# Patient Record
Sex: Female | Born: 1950
Health system: Southern US, Community
[De-identification: ages and names within clinical notes are randomized; demographics above are authoritative.]

## PROBLEM LIST (undated history)

## (undated) DIAGNOSIS — R112 Nausea with vomiting, unspecified: Secondary | ICD-10-CM

## (undated) DIAGNOSIS — E039 Hypothyroidism, unspecified: Secondary | ICD-10-CM

## (undated) DIAGNOSIS — E7800 Pure hypercholesterolemia, unspecified: Secondary | ICD-10-CM

## (undated) DIAGNOSIS — Z9889 Other specified postprocedural states: Secondary | ICD-10-CM

## (undated) DIAGNOSIS — I10 Essential (primary) hypertension: Secondary | ICD-10-CM

---

## 2001-02-15 ENCOUNTER — Other Ambulatory Visit: Admission: RE | Admit: 2001-02-15 | Discharge: 2001-02-15 | Payer: Self-pay | Admitting: Family Medicine

## 2001-02-16 ENCOUNTER — Encounter: Payer: Self-pay | Admitting: Family Medicine

## 2001-02-16 ENCOUNTER — Ambulatory Visit (HOSPITAL_COMMUNITY): Admission: RE | Admit: 2001-02-16 | Discharge: 2001-02-16 | Payer: Self-pay | Admitting: Family Medicine

## 2004-02-01 ENCOUNTER — Ambulatory Visit (HOSPITAL_COMMUNITY): Admission: RE | Admit: 2004-02-01 | Discharge: 2004-02-01 | Payer: Self-pay | Admitting: Family Medicine

## 2004-02-05 ENCOUNTER — Ambulatory Visit (HOSPITAL_COMMUNITY): Admission: RE | Admit: 2004-02-05 | Discharge: 2004-02-05 | Payer: Self-pay | Admitting: Family Medicine

## 2008-05-05 ENCOUNTER — Emergency Department (HOSPITAL_COMMUNITY): Admission: EM | Admit: 2008-05-05 | Discharge: 2008-05-05 | Payer: Self-pay | Admitting: Emergency Medicine

## 2008-10-15 ENCOUNTER — Ambulatory Visit (HOSPITAL_COMMUNITY): Admission: RE | Admit: 2008-10-15 | Discharge: 2008-10-15 | Payer: Self-pay | Admitting: Internal Medicine

## 2010-08-18 LAB — STREP A DNA PROBE: Group A Strep Probe: NEGATIVE

## 2010-08-18 LAB — RAPID STREP SCREEN (MED CTR MEBANE ONLY): Streptococcus, Group A Screen (Direct): NEGATIVE

## 2010-12-16 ENCOUNTER — Other Ambulatory Visit (HOSPITAL_COMMUNITY): Payer: Self-pay | Admitting: Internal Medicine

## 2010-12-16 DIAGNOSIS — Z139 Encounter for screening, unspecified: Secondary | ICD-10-CM

## 2010-12-18 ENCOUNTER — Ambulatory Visit (HOSPITAL_COMMUNITY)
Admission: RE | Admit: 2010-12-18 | Discharge: 2010-12-18 | Disposition: A | Discharge status: Home / Self Care | Payer: BC Managed Care – PPO | Source: Ambulatory Visit | Attending: Internal Medicine | Admitting: Internal Medicine

## 2010-12-18 DIAGNOSIS — Z139 Encounter for screening, unspecified: Secondary | ICD-10-CM

## 2010-12-18 DIAGNOSIS — Z1231 Encounter for screening mammogram for malignant neoplasm of breast: Secondary | ICD-10-CM | POA: Insufficient documentation

## 2010-12-19 ENCOUNTER — Other Ambulatory Visit (HOSPITAL_COMMUNITY): Payer: Self-pay | Admitting: Internal Medicine

## 2010-12-19 DIAGNOSIS — Z139 Encounter for screening, unspecified: Secondary | ICD-10-CM

## 2010-12-24 ENCOUNTER — Ambulatory Visit (HOSPITAL_COMMUNITY)
Admission: RE | Admit: 2010-12-24 | Discharge: 2010-12-24 | Disposition: A | Discharge status: Home / Self Care | Payer: BC Managed Care – PPO | Source: Ambulatory Visit | Attending: Internal Medicine | Admitting: Internal Medicine

## 2010-12-24 ENCOUNTER — Telehealth: Payer: Self-pay

## 2010-12-24 DIAGNOSIS — M949 Disorder of cartilage, unspecified: Secondary | ICD-10-CM | POA: Insufficient documentation

## 2010-12-24 DIAGNOSIS — Z13820 Encounter for screening for osteoporosis: Secondary | ICD-10-CM | POA: Insufficient documentation

## 2010-12-24 DIAGNOSIS — Z139 Encounter for screening, unspecified: Secondary | ICD-10-CM

## 2010-12-24 DIAGNOSIS — M899 Disorder of bone, unspecified: Secondary | ICD-10-CM | POA: Insufficient documentation

## 2010-12-24 NOTE — Telephone Encounter (Signed)
Gastroenterology Pre-Procedure Form  Request Date: 12/24/2010      Requesting Physician: Dwana Melena, MD     PATIENT INFORMATION:  Kaitlyn Franklin is a 60 y.o., female (DOB=14-Jul-1950).  PROCEDURE: Procedure(s) requested: colonoscopy Procedure Reason: screening for colon cancer  PATIENT REVIEW QUESTIONS: The patient reports the following:   1. Diabetes Melitis: no 2. Joint replacements in the past 12 months: no 3. Major health problems in the past 3 months: no 4. Has an artificial valve or MVP:no 5. Has been advised in past to take antibiotics in advance of a procedure like teeth cleaning: no}    MEDICATIONS & ALLERGIES:    Patient reports the following regarding taking any blood thinners:   Plavix? no Aspirin?yes  Coumadin?  no  Patient confirms/reports the following medications:  Current Outpatient Prescriptions  Medication Sig Dispense Refill  . aspirin 81 MG tablet Take 81 mg by mouth daily.        . fish oil-omega-3 fatty acids 1000 MG capsule Take 1 g by mouth daily.        Marland Kitchen levothyroxine (SYNTHROID, LEVOTHROID) 75 MCG tablet Take 75 mcg by mouth daily.        . Multiple Vitamin (MULTIVITAMIN) tablet Take 1 tablet by mouth daily.        Marland Kitchen telmisartan-hydrochlorothiazide (MICARDIS HCT) 40-12.5 MG per tablet Take 1 tablet by mouth daily.        Marland Kitchen UNKNOWN TO PATIENT Calcium 600mg  plus Vit D One tablet twice a day       . vitamin A 8000 UNIT capsule Take 8,000 Units by mouth daily.          Patient confirms/reports the following allergies:  Not on File  Patient is appropriate to schedule for requested procedure(s): yes  AUTHORIZATION INFORMATION Primary Insurance: ,  ID #  Group # Pre-Cert / Auth required: Pre-Cert / Auth #:   Secondary Insurance: ,  ID #: ,  Group #:  Pre-Cert / Auth required Pre-Cert / Auth #:   No orders of the defined types were placed in this encounter.    SCHEDULE INFORMATION: Procedure has been scheduled as follows:  Date: 12/31/2010       Time: 9:45 AM  Location: [facility] Short Stay  This Gastroenterology Pre-Precedure Form is being routed to the following provider(s) for review: R. Roetta Sessions, MD

## 2010-12-24 NOTE — Telephone Encounter (Signed)
LMOM for pt to call to be triaged for a screening colonoscopy.

## 2010-12-25 ENCOUNTER — Other Ambulatory Visit: Payer: Self-pay

## 2010-12-25 DIAGNOSIS — Z139 Encounter for screening, unspecified: Secondary | ICD-10-CM

## 2010-12-25 NOTE — Telephone Encounter (Signed)
OK for colonoscopy.  

## 2010-12-25 NOTE — Telephone Encounter (Signed)
Faxing the instructions and Rx to [facility].

## 2010-12-26 NOTE — Telephone Encounter (Signed)
Closed

## 2010-12-30 MED ORDER — SODIUM CHLORIDE 0.45 % IV SOLN
Freq: Once | INTRAVENOUS | Status: AC
  Administered 2010-12-31: 11:00:00 via INTRAVENOUS

## 2010-12-31 ENCOUNTER — Ambulatory Visit (HOSPITAL_COMMUNITY)
Admission: RE | Admit: 2010-12-31 | Discharge: 2010-12-31 | Disposition: A | Discharge status: Home / Self Care | Payer: BC Managed Care – PPO | Source: Ambulatory Visit | Attending: Internal Medicine | Admitting: Internal Medicine

## 2010-12-31 ENCOUNTER — Encounter (HOSPITAL_COMMUNITY): Payer: Self-pay | Admitting: *Deleted

## 2010-12-31 ENCOUNTER — Encounter (HOSPITAL_COMMUNITY)
Admission: RE | Disposition: A | Discharge status: Home / Self Care | Payer: Self-pay | Source: Ambulatory Visit | Attending: Internal Medicine

## 2010-12-31 ENCOUNTER — Other Ambulatory Visit: Payer: Self-pay | Admitting: Internal Medicine

## 2010-12-31 DIAGNOSIS — D126 Benign neoplasm of colon, unspecified: Secondary | ICD-10-CM

## 2010-12-31 DIAGNOSIS — Z7982 Long term (current) use of aspirin: Secondary | ICD-10-CM | POA: Insufficient documentation

## 2010-12-31 DIAGNOSIS — K5730 Diverticulosis of large intestine without perforation or abscess without bleeding: Secondary | ICD-10-CM | POA: Insufficient documentation

## 2010-12-31 DIAGNOSIS — Z1211 Encounter for screening for malignant neoplasm of colon: Secondary | ICD-10-CM | POA: Insufficient documentation

## 2010-12-31 DIAGNOSIS — Z79899 Other long term (current) drug therapy: Secondary | ICD-10-CM | POA: Insufficient documentation

## 2010-12-31 DIAGNOSIS — I10 Essential (primary) hypertension: Secondary | ICD-10-CM | POA: Insufficient documentation

## 2010-12-31 DIAGNOSIS — E7800 Pure hypercholesterolemia, unspecified: Secondary | ICD-10-CM | POA: Insufficient documentation

## 2010-12-31 HISTORY — DX: Hypothyroidism, unspecified: E03.9

## 2010-12-31 HISTORY — DX: Pure hypercholesterolemia, unspecified: E78.00

## 2010-12-31 HISTORY — PX: COLONOSCOPY: SHX5424

## 2010-12-31 HISTORY — DX: Essential (primary) hypertension: I10

## 2010-12-31 SURGERY — COLONOSCOPY
Anesthesia: Moderate Sedation

## 2010-12-31 MED ORDER — MEPERIDINE HCL 100 MG/ML IJ SOLN
INTRAMUSCULAR | Status: DC | PRN
  Administered 2010-12-31 (×2): 50 mg via INTRAVENOUS

## 2010-12-31 MED ORDER — MEPERIDINE HCL 100 MG/ML IJ SOLN
INTRAMUSCULAR | Status: AC
  Filled 2010-12-31: qty 2

## 2010-12-31 MED ORDER — MIDAZOLAM HCL 5 MG/5ML IJ SOLN
INTRAMUSCULAR | Status: DC | PRN
  Administered 2010-12-31: 1 mg via INTRAVENOUS
  Administered 2010-12-31 (×2): 2 mg via INTRAVENOUS

## 2010-12-31 MED ORDER — MIDAZOLAM HCL 5 MG/5ML IJ SOLN
INTRAMUSCULAR | Status: AC
  Filled 2010-12-31: qty 10

## 2010-12-31 NOTE — H&P (Signed)
Primary Care Physician:  Dwana Melena, MD Primary Gastroenterologist:  Dr. Jena Gauss  Pre-Procedure History & Physical: HPI:  Kaitlyn Franklin is a 60 y.o. female is here for a screening colonoscopy. No bowel symptoms. No family history of Polyps or colon cancer. This is her first ever colonoscopy.  Past Medical History  Diagnosis Date  . Hypertension   . Hypercholesteremia   . Hypothyroidism     History reviewed. No pertinent past surgical history.  Prior to Admission medications   Medication Sig Start Date End Date Taking? Authorizing Provider  acetaminophen (TYLENOL) 500 MG tablet Take 1,000 mg by mouth every 6 (six) hours as needed.     Yes Historical Provider, MD  aspirin 81 MG tablet Take 81 mg by mouth daily.     Yes Historical Provider, MD  fish oil-omega-3 fatty acids 1000 MG capsule Take 1 g by mouth daily.     Yes Historical Provider, MD  levothyroxine (SYNTHROID, LEVOTHROID) 75 MCG tablet Take 75 mcg by mouth daily.     Yes Historical Provider, MD  Multiple Vitamin (MULTIVITAMIN) tablet Take 1 tablet by mouth daily.     Yes Historical Provider, MD  rosuvastatin (CRESTOR) 5 MG tablet Take 5 mg by mouth daily.     Yes Historical Provider, MD  telmisartan-hydrochlorothiazide (MICARDIS HCT) 40-12.5 MG per tablet Take 1 tablet by mouth daily.     Yes Historical Provider, MD  UNKNOWN TO PATIENT Calcium 600mg  plus Vit D One tablet twice a day    Yes Historical Provider, MD  vitamin A 8000 UNIT capsule Take 8,000 Units by mouth daily.     Yes Historical Provider, MD    Allergies as of 12/25/2010  . (No Known Allergies)    History reviewed. No pertinent family history.  History   Social History  . Marital Status: Married    Spouse Name: N/A    Number of Children: N/A  . Years of Education: N/A   Occupational History  . Not on file.   Social History Main Topics  . Smoking status: Never Smoker   . Smokeless tobacco: Not on file  . Alcohol Use: No  . Drug Use: No  .  Sexually Active:    Other Topics Concern  . Not on file   Social History Narrative  . No narrative on file    Review of Systems: See HPI, otherwise negative ROS  Physical Exam: BP 155/86  Pulse 90  Temp(Src) 98.9 F (37.2 C) (Oral)  Resp 13  Ht 5\' 4"  (1.626 m)  Wt 197 lb (89.359 kg)  BMI 33.82 kg/m2  SpO2 98% General:   Alert,  Well-developed, well-nourished, pleasant and cooperative in NAD Head:  Normocephalic and atraumatic. Eyes:  Sclera clear, no icterus.   Conjunctiva pink. Ears:  Normal auditory acuity. Nose:  No deformity, discharge,  or lesions. Mouth:  No deformity or lesions, dentition normal. Neck:  Supple; no masses or thyromegaly. Lungs:  Clear throughout to auscultation.   No wheezes, crackles, or rhonchi. No acute distress. Heart:  Regular rate and rhythm; no murmurs, clicks, rubs,  or gallops. Abdomen:  Soft, nontender and nondistended. No masses, hepatosplenomegaly or hernias noted. Normal bowel sounds, without guarding, and without rebound.   Msk:  Symmetrical without gross deformities. Normal posture. Pulses:  Normal pulses noted. Extremities:  Without clubbing or edema. Neurologic:  Alert and  oriented x4;  grossly normal neurologically. Skin:  Intact without significant lesions or rashes. Cervical Nodes:  No significant cervical adenopathy. Psych:  Alert and cooperative. Normal mood and affect.  Impression/Plan: Kaitlyn Franklin is now here to undergo a screening colonoscopy.   Average risk.  Risks, benefits, limitations, imponderables and alternatives regarding colonoscopy have been reviewed with the patient. Questions have been answered. All parties agreeable.

## 2011-01-06 ENCOUNTER — Encounter: Payer: Self-pay | Admitting: Internal Medicine

## 2011-01-08 ENCOUNTER — Encounter (HOSPITAL_COMMUNITY): Payer: Self-pay | Admitting: Internal Medicine

## 2011-10-22 ENCOUNTER — Other Ambulatory Visit (HOSPITAL_COMMUNITY): Payer: BC Managed Care – PPO

## 2012-02-11 ENCOUNTER — Encounter (HOSPITAL_COMMUNITY): Payer: Self-pay | Admitting: Pharmacy Technician

## 2012-02-17 NOTE — Patient Instructions (Addendum)
20 Kaitlyn Franklin  02/17/2012   Your procedure is scheduled on:   02/25/2012   Report to [facility] at  615  AM.  Call this number if you have problems the morning of surgery: [phone]   Remember:   Do not eat food:After Midnight.  May have clear liquids:until Midnight .    Take these medicines the morning of surgery with A SIP OF WATER: synthroid,micardis   Do not wear jewelry, make-up or nail polish.  Do not wear lotions, powders, or perfumes. You may wear deodorant.  Do not shave 48 hours prior to surgery. Men may shave face and neck.  Do not bring valuables to the hospital.  Contacts, dentures or bridgework may not be worn into surgery.  Leave suitcase in the car. After surgery it may be brought to your room.  For patients admitted to the hospital, checkout time is 11:00 AM the day of discharge.   Patients discharged the day of surgery will not be allowed to drive home.  Name and phone number of your driver: family  Special Instructions: Shower using CHG 2 nights before surgery and the night before surgery.  If you shower the day of surgery use CHG.  Use special wash - you have one bottle of CHG for all showers.  You should use approximately 1/3 of the bottle for each shower.   Please read over the following fact sheets that you were given: Pain Booklet, MRSA Information, Surgical Site Infection Prevention, Anesthesia Post-op Instructions and Care and Recovery After Surgery Bunionectomy A bunionectomy is surgery to remove a bunion. A bunion is an enlargement of the joint at the base of the big toe. It is made up of bone and soft tissue on the inside part of the joint. Over time, a painful lump appears on the inside of the joint. The big toe begins to point inward toward the second toe. New bone growth can occur and a bone spur may form. The pain eventually causes difficulty walking. A bunion usually results from inflammation caused by the irritation of poorly fitting shoes.  It often begins later in life. A bunionectomy is performed when nonsurgical treatment no longer works. When surgery is needed, the extent of the procedure will depend on the degree of deformity of the foot. Your surgeon will discuss with you the different procedures and what will work best for you depending on your age and health. LET YOUR CAREGIVER KNOW ABOUT:   Previous problems with anesthetics or medicines used to numb the skin.  Allergies to dyes, iodine, foods, and/or latex.  Medicines taken including herbs, eye drops, prescription medicines (especially medicines used to "thin the blood"), aspirin and other over-the-counter medicines, and steroids (by mouth or as a cream).  History of bleeding or blood problems.  Possibility of pregnancy, if this applies.  History of blood clots in your legs and/or lungs .  Previous surgery.  Other important health problems. RISKS AND COMPLICATIONS   Infection.  Pain.  Nerve damage.  Possibility that the bunion will recur. BEFORE THE PROCEDURE  You should be present 60 minutes prior to your procedure or as directed.  PROCEDURE  Surgery is often done so that you can go home the same day (outpatient). It may be done in a hospital or in an outpatient surgical center. An anesthetic will be used to help you sleep during the procedure. Sometimes, a spinal anesthetic is used to make you numb below the waist. A cut (  incision) is made over the swollen area at the first joint of the big toe. The enlarged lump will be removed. If there is a need to reposition the bones of the big toe, this may require more than 1 incision. The bone itself may need to be cut. Screws and wires may be used in the repair. These can be removed at a later date. In severe cases, the entire joint may need to be removed and a joint replacement inserted. When done, the incision is closed with stitches (sutures). Skin adhesive strips may be added for reinforcement. They help hold the  incision closed.  AFTER THE PROCEDURE  Compression bandages (dressings) are then wrapped around the wound. This helps to keep the foot in alignment and reduce swelling. Your foot will be monitored for bleeding and swelling. You will need to stay for a few hours in the recovery area before being discharged. This allows time for the anesthesia to wear off. You will be discharged home when you are awake, stable, and doing well. HOME CARE INSTRUCTIONS   You can expect to return to normal activities within 6 to 8 weeks after surgery. The foot is at increased risk for swelling for several months. When you can expect to bear weight on the operated foot will depend on the extent of your surgery. The milder the deformity, the less tissue is removed and the sooner the return to normal activity level. During the recovery period, a special shoe, boot, or cast may be worn to accommodate the surgical bandage and to help provide stability to the foot.  Once you are home, an ice pack applied to the operative site may help with discomfort and keep swelling down. Stop using the ice if it causes discomfort.  Keep your feet raised (elevated) when possible to lessen swelling.  If you have an elastic bandage on your foot and you have numbness, tingling, or your foot becomes cold and blue, adjust the bandage to make it comfortable.  Change dressings as directed.  Keep the wound dry and clean. The wound may be washed gently with soap and water. Gently blot dry without rubbing. Do not take baths or use swimming pools or hot tubs for 10 days, or as instructed by your caregiver.  Only take over-the-counter or prescription medicines for pain, discomfort, or fever as directed by your caregiver.  You may continue a normal diet as directed.  For activity, use crutches with no weight bearing or your orthopedic shoe as directed. Continue to use crutches or a cane as directed until you can stand without causing pain. SEEK  MEDICAL CARE IF:   You have redness, swelling, bruising, or increasing pain in the wound.  There is pus coming from the wound.  You have drainage from a wound lasting longer than 1 day.  You have an oral temperature above 102 F (38.9 C).  You notice a bad smell coming from the wound or dressing.  The wound breaks open after sutures have been removed.  You develop dizzy episodes or fainting while standing.  You have persistent nausea or vomiting.  Your toes become cold.  Pain is not relieved with medicines. SEEK IMMEDIATE MEDICAL CARE IF:   You develop a rash.  You have difficulty breathing.  You develop any reaction or side effects to medicines given.  Your toes are numb or blue, or you have severe pain. MAKE SURE YOU:   Understand these instructions.  Will watch your condition.  Will get help  right away if you are not doing well or get worse. Document Released: 04/03/2005 Document Revised: 07/13/2011 Document Reviewed: 05/09/2007 [facility] Patient Information 2013 [place], [place]. PATIENT INSTRUCTIONS POST-ANESTHESIA  IMMEDIATELY FOLLOWING SURGERY:  Do not drive or operate machinery for the first twenty four hours after surgery.  Do not make any important decisions for twenty four hours after surgery or while taking narcotic pain medications or sedatives.  If you develop intractable nausea and vomiting or a severe headache please notify your doctor immediately.  FOLLOW-UP:  Please make an appointment with your surgeon as instructed. You do not need to follow up with anesthesia unless specifically instructed to do so.  WOUND CARE INSTRUCTIONS (if applicable):  Keep a dry clean dressing on the anesthesia/puncture wound site if there is drainage.  Once the wound has quit draining you may leave it open to air.  Generally you should leave the bandage intact for twenty four hours unless there is drainage.  If the epidural site drains for more than 36-48 hours please call the  anesthesia department.  QUESTIONS?:  Please feel free to call your physician or the hospital operator if you have any questions, and they will be happy to assist you.

## 2012-02-18 ENCOUNTER — Encounter (HOSPITAL_COMMUNITY)
Admission: RE | Admit: 2012-02-18 | Discharge: 2012-02-18 | Disposition: A | Discharge status: Home / Self Care | Payer: BC Managed Care – PPO | Source: Ambulatory Visit | Attending: Podiatry | Admitting: Podiatry

## 2012-02-18 ENCOUNTER — Encounter (HOSPITAL_COMMUNITY): Payer: Self-pay

## 2012-02-18 HISTORY — DX: Nausea with vomiting, unspecified: Z98.890

## 2012-02-18 HISTORY — DX: Nausea with vomiting, unspecified: R11.2

## 2012-02-18 LAB — BASIC METABOLIC PANEL
Chloride: 102 mEq/L (ref 96–112)
Creatinine, Ser: 0.7 mg/dL (ref 0.50–1.10)
GFR calc Af Amer: >90 mL/min (ref >90)
GFR calc non Af Amer: >90 mL/min (ref >90)

## 2012-02-18 LAB — SURGICAL PCR SCREEN: Staphylococcus aureus: POSITIVE — AB

## 2012-02-18 LAB — HEMOGLOBIN AND HEMATOCRIT, BLOOD: Hemoglobin: 12.9 g/dL (ref 12.0–15.0)

## 2012-02-25 ENCOUNTER — Ambulatory Visit (HOSPITAL_COMMUNITY): Payer: BC Managed Care – PPO

## 2012-02-25 ENCOUNTER — Ambulatory Visit (HOSPITAL_COMMUNITY): Payer: BC Managed Care – PPO | Admitting: Anesthesiology

## 2012-02-25 ENCOUNTER — Encounter (HOSPITAL_COMMUNITY)
Admission: RE | Disposition: A | Discharge status: Home / Self Care | Payer: Self-pay | Source: Ambulatory Visit | Attending: Podiatry

## 2012-02-25 ENCOUNTER — Ambulatory Visit (HOSPITAL_COMMUNITY)
Admission: RE | Admit: 2012-02-25 | Discharge: 2012-02-25 | Disposition: A | Discharge status: Home / Self Care | Payer: BC Managed Care – PPO | Source: Ambulatory Visit | Attending: Podiatry | Admitting: Podiatry

## 2012-02-25 ENCOUNTER — Encounter (HOSPITAL_COMMUNITY): Payer: Self-pay

## 2012-02-25 ENCOUNTER — Encounter (HOSPITAL_COMMUNITY): Payer: Self-pay | Admitting: Anesthesiology

## 2012-02-25 DIAGNOSIS — M2010 Hallux valgus (acquired), unspecified foot: Secondary | ICD-10-CM | POA: Insufficient documentation

## 2012-02-25 DIAGNOSIS — M21619 Bunion of unspecified foot: Secondary | ICD-10-CM | POA: Insufficient documentation

## 2012-02-25 DIAGNOSIS — I10 Essential (primary) hypertension: Secondary | ICD-10-CM | POA: Insufficient documentation

## 2012-02-25 DIAGNOSIS — Z01810 Encounter for preprocedural cardiovascular examination: Secondary | ICD-10-CM | POA: Insufficient documentation

## 2012-02-25 DIAGNOSIS — Z01812 Encounter for preprocedural laboratory examination: Secondary | ICD-10-CM | POA: Insufficient documentation

## 2012-02-25 HISTORY — PX: BUNIONECTOMY: SHX129

## 2012-02-25 SURGERY — BUNIONECTOMY
Anesthesia: Monitor Anesthesia Care | Site: Foot | Laterality: Right | Wound class: Clean

## 2012-02-25 MED ORDER — ONDANSETRON HCL 4 MG/2ML IJ SOLN
INTRAMUSCULAR | Status: AC
  Filled 2012-02-25: qty 2

## 2012-02-25 MED ORDER — FENTANYL CITRATE 0.05 MG/ML IJ SOLN
INTRAMUSCULAR | Status: AC
  Filled 2012-02-25: qty 2

## 2012-02-25 MED ORDER — 0.9 % SODIUM CHLORIDE (POUR BTL) OPTIME
TOPICAL | Status: DC | PRN
  Administered 2012-02-25: 1000 mL

## 2012-02-25 MED ORDER — CEFAZOLIN SODIUM-DEXTROSE 2-3 GM-% IV SOLR
INTRAVENOUS | Status: AC
  Filled 2012-02-25: qty 50

## 2012-02-25 MED ORDER — FENTANYL CITRATE 0.05 MG/ML IJ SOLN
INTRAMUSCULAR | Status: DC | PRN
  Administered 2012-02-25: 50 ug via INTRAVENOUS
  Administered 2012-02-25 (×2): 25 ug via INTRAVENOUS

## 2012-02-25 MED ORDER — ONDANSETRON HCL 4 MG/2ML IJ SOLN
4.0000 mg | Freq: Once | INTRAMUSCULAR | Status: AC
  Administered 2012-02-25: 4 mg via INTRAVENOUS

## 2012-02-25 MED ORDER — FENTANYL CITRATE 0.05 MG/ML IJ SOLN: 25.0000 ug | INTRAMUSCULAR | Status: DC | PRN

## 2012-02-25 MED ORDER — ONDANSETRON HCL 4 MG/2ML IJ SOLN: 4.0000 mg | Freq: Once | INTRAMUSCULAR | Status: AC | PRN

## 2012-02-25 MED ORDER — PROPOFOL INFUSION 10 MG/ML OPTIME
INTRAVENOUS | Status: DC | PRN
  Administered 2012-02-25: 55 ug/kg/min via INTRAVENOUS

## 2012-02-25 MED ORDER — CEFAZOLIN SODIUM-DEXTROSE 2-3 GM-% IV SOLR: 2.0000 g | Freq: Once | INTRAVENOUS | Status: DC

## 2012-02-25 MED ORDER — BUPIVACAINE HCL (PF) 0.5 % IJ SOLN
INTRAMUSCULAR | Status: AC
  Filled 2012-02-25: qty 30

## 2012-02-25 MED ORDER — LACTATED RINGERS IV SOLN
INTRAVENOUS | Status: DC
  Administered 2012-02-25: 07:00:00 via INTRAVENOUS

## 2012-02-25 MED ORDER — MIDAZOLAM HCL 2 MG/2ML IJ SOLN
INTRAMUSCULAR | Status: AC
  Filled 2012-02-25: qty 2

## 2012-02-25 MED ORDER — MIDAZOLAM HCL 5 MG/5ML IJ SOLN
INTRAMUSCULAR | Status: DC | PRN
  Administered 2012-02-25: 2 mg via INTRAVENOUS

## 2012-02-25 MED ORDER — MIDAZOLAM HCL 2 MG/2ML IJ SOLN
1.0000 mg | INTRAMUSCULAR | Status: DC | PRN
  Administered 2012-02-25: 2 mg via INTRAVENOUS

## 2012-02-25 MED ORDER — CEFAZOLIN SODIUM-DEXTROSE 2-3 GM-% IV SOLR
INTRAVENOUS | Status: DC | PRN
  Administered 2012-02-25: 2 g via INTRAVENOUS

## 2012-02-25 MED ORDER — BUPIVACAINE HCL (PF) 0.5 % IJ SOLN
INTRAMUSCULAR | Status: DC | PRN
  Administered 2012-02-25: 20 mL

## 2012-02-25 MED ORDER — PROPOFOL 10 MG/ML IV EMUL
INTRAVENOUS | Status: AC
  Filled 2012-02-25: qty 20

## 2012-02-25 SURGICAL SUPPLY — 58 items
BAG HAMPER (MISCELLANEOUS) ×2 IMPLANT
BANDAGE CONFORM 3  STR LF (GAUZE/BANDAGES/DRESSINGS) ×2 IMPLANT
BANDAGE ELASTIC 4 VELCRO NS (GAUZE/BANDAGES/DRESSINGS) ×2 IMPLANT
BANDAGE ESMARK 4X12 BL STRL LF (DISPOSABLE) ×1 IMPLANT
BANDAGE GAUZE ELAST BULKY 4 IN (GAUZE/BANDAGES/DRESSINGS) ×2 IMPLANT
BENZOIN TINCTURE PRP APPL 2/3 (GAUZE/BANDAGES/DRESSINGS) ×2 IMPLANT
BIT DRILL 1.5 (BIT) ×1 IMPLANT
BIT DRILL MINI QC 2.0X65 (BIT) ×2 IMPLANT
BLADE AVERAGE 25X9 (BLADE) ×2 IMPLANT
BLADE SURG 15 STRL LF DISP TIS (BLADE) ×3 IMPLANT
BLADE SURG 15 STRL SS (BLADE) ×3
BNDG ESMARK 4X12 BLUE STRL LF (DISPOSABLE) ×2
CHLORAPREP W/TINT 26ML (MISCELLANEOUS) ×2 IMPLANT
CLOTH BEACON ORANGE TIMEOUT ST (SAFETY) ×2 IMPLANT
COVER LIGHT HANDLE STERIS (MISCELLANEOUS) ×4 IMPLANT
CUFF TOURNIQUET SINGLE 18IN (TOURNIQUET CUFF) ×2 IMPLANT
DECANTER SPIKE VIAL GLASS SM (MISCELLANEOUS) ×2 IMPLANT
DRAPE OEC MINIVIEW 54X84 (DRAPES) ×2 IMPLANT
DRILL BIT 1.5 (BIT) ×1
DRSG ADAPTIC 3X8 NADH LF (GAUZE/BANDAGES/DRESSINGS) ×2 IMPLANT
DURA STEPPER LG (CAST SUPPLIES) IMPLANT
DURA STEPPER MED (CAST SUPPLIES) ×2 IMPLANT
DURA STEPPER SML (CAST SUPPLIES) IMPLANT
DURA STEPPER XL (SOFTGOODS) IMPLANT
ELECT REM PT RETURN 9FT ADLT (ELECTROSURGICAL) ×2
ELECTRODE REM PT RTRN 9FT ADLT (ELECTROSURGICAL) ×1 IMPLANT
GLOVE BIO SURGEON STRL SZ7.5 (GLOVE) ×2 IMPLANT
GLOVE BIOGEL PI IND STRL 7.0 (GLOVE) ×2 IMPLANT
GLOVE BIOGEL PI INDICATOR 7.0 (GLOVE) ×2
GLOVE EXAM NITRILE MD LF STRL (GLOVE) ×2 IMPLANT
GLOVE SS BIOGEL STRL SZ 6.5 (GLOVE) ×2 IMPLANT
GLOVE SUPERSENSE BIOGEL SZ 6.5 (GLOVE) ×2
GOWN STRL REIN XL XLG (GOWN DISPOSABLE) ×6 IMPLANT
K-WIRE 6 (WIRE)
K-WIRE NON THREAD 1.1 (WIRE) ×2
K-WIRE PROS 1.0X150 (Wire) ×2 IMPLANT
KIT ROOM TURNOVER AP CYSTO (KITS) ×2 IMPLANT
KIT ROOM TURNOVER APOR (KITS) ×2 IMPLANT
KWIRE 6 (WIRE) IMPLANT
KWIRE NON THREAD 1.1 (WIRE) ×1 IMPLANT
KWIRE PROS 1.0X150 (Wire) ×1 IMPLANT
MANIFOLD NEPTUNE II (INSTRUMENTS) ×2 IMPLANT
NEEDLE HYPO 27GX1-1/4 (NEEDLE) ×4 IMPLANT
NS IRRIG 1000ML POUR BTL (IV SOLUTION) ×2 IMPLANT
PACK BASIC LIMB (CUSTOM PROCEDURE TRAY) ×2 IMPLANT
PAD ARMBOARD 7.5X6 YLW CONV (MISCELLANEOUS) ×2 IMPLANT
PIN CAPS ORTHO GREEN .062 (PIN) IMPLANT
RASP SM TEAR CROSS CUT (RASP) IMPLANT
SCREW CANN COMP 3.0X20 (Screw) ×2 IMPLANT
SCREW CORTEX ST 2.0X20 (Screw) ×2 IMPLANT
SET BASIN LINEN APH (SET/KITS/TRAYS/PACK) ×2 IMPLANT
SPONGE GAUZE 4X4 12PLY (GAUZE/BANDAGES/DRESSINGS) ×2 IMPLANT
STRIP CLOSURE SKIN 1/2X4 (GAUZE/BANDAGES/DRESSINGS) ×2 IMPLANT
SUT PROLENE 4 0 PS 2 18 (SUTURE) ×6 IMPLANT
SUT VIC AB 2-0 CT2 27 (SUTURE) ×4 IMPLANT
SUT VIC AB 4-0 PS2 27 (SUTURE) ×2 IMPLANT
SUT VICRYL AB 3-0 FS1 BRD 27IN (SUTURE) ×2 IMPLANT
SYR CONTROL 10ML LL (SYRINGE) ×4 IMPLANT

## 2012-02-25 NOTE — Anesthesia Postprocedure Evaluation (Signed)
  Anesthesia Post-op Note  Patient: Kaitlyn Franklin  Procedure(s) Performed: Procedure(s) (LRB) with comments: BUNIONECTOMY (Right) - Austin Bunionectomy Right Foot, Tailors Bunionectomy Right Foot  Patient Location: PACU and Short Stay  Anesthesia Type: MAC  Level of Consciousness: awake, alert , oriented and patient cooperative  Airway and Oxygen Therapy: Patient Spontanous Breathing  Post-op Pain: none  Post-op Assessment: Post-op Vital signs reviewed, Patient's Cardiovascular Status Stable, Respiratory Function Stable, Patent Airway, No signs of Nausea or vomiting and Pain level controlled  Post-op Vital Signs: Reviewed and stable  Complications: No apparent anesthesia complications

## 2012-02-25 NOTE — Anesthesia Preprocedure Evaluation (Addendum)
Anesthesia Evaluation  Patient identified by MRN, date of birth, ID band Patient awake    Reviewed: Allergy & Precautions, H&P , NPO status , Patient's Chart, lab work & pertinent test results  History of Anesthesia Complications (+) PONV  Airway Mallampati: II    Mouth opening: Limited Mouth Opening  Dental  (+) Teeth Intact   Pulmonary neg pulmonary ROS,  breath sounds clear to auscultation        Cardiovascular hypertension, Pt. on medications Rhythm:Regular Rate:Normal     Neuro/Psych    GI/Hepatic negative GI ROS,   Endo/Other  Hypothyroidism   Renal/GU      Musculoskeletal   Abdominal   Peds  Hematology   Anesthesia Other Findings   Reproductive/Obstetrics                           Anesthesia Physical Anesthesia Plan  ASA: II  Anesthesia Plan: MAC   Post-op Pain Management:    Induction: Intravenous  Airway Management Planned: Nasal Cannula  Additional Equipment:   Intra-op Plan:   Post-operative Plan:   Informed Consent: I have reviewed the patients History and Physical, chart, labs and discussed the procedure including the risks, benefits and alternatives for the proposed anesthesia with the patient or authorized representative who has indicated his/her understanding and acceptance.     Plan Discussed with:   Anesthesia Plan Comments:         Anesthesia Quick Evaluation

## 2012-02-25 NOTE — Op Note (Signed)
OPERATIVE REPORT  SURGEON:   Dallas Schimke, DPM  OR STAFF:   Cyndie Chime, RN - Circulator Maggie Lucile Crater, CST - Scrub Person Lennox Pippins, RN - RN First Assistant Hardin Negus, RN - Careers adviser   PREOPERATIVE DIAGNOSIS:   1.  Hallux valgus right foot 2.  Tailors bunion right foot  POSTOPERATIVE DIAGNOSIS: Same  PROCEDURE: 1.  Austin bunionectomy right foot 2.  Tailors bunionectomy right foot with osteotomy  ANESTHESIA:  Monitor Anesthesia Care   HEMOSTASIS:   Pneumatic ankle tourniquet set at 250 mmHg  ESTIMATED BLOOD LOSS:   Minimal (<5 cc)  MATERIALS USED:  3.0 mm by 20 mm cannulated Synthes screw, 2.0 mm by 14 mm Synthes screw  INJECTABLES: Marcaine 0.5% plain; 20mL  PATHOLOGY:   None  COMPLICATIONS:   None  INDICATIONS:  Painful bunion and tailor's bunion deformity of the right foot non-responsive to conservative care.  DESCRIPTION OF THE PROCEDURE:   The patient was brought to the operating room and placed on the operative table in the supine position.  A pneumatic ankle tourniquet was applied to the patient's ankle.  Following sedation, the surgical site was anesthetized with 0.5% Marcaine plain.  The foot was then prepped, scrubbed, and draped in the usual sterile technique.  The foot was elevated, exsanguinated and the pneumatic ankle tourniquet inflated to 250 mmHg.    Attention was then directed to the dorsomedial aspect of the first metatarsal phalangeal joint where a 6-cm linear incision was made medial and parallel to the course of the extensor hallucis longus tendon.  The incision was deepened through subcutaneous tissues.  Vital neurovascular structures were identified and retracted.  All bleeders were identified and cauterized.  The incision was deepened to the level of the first metatarsal phalangeal joint capsule.  An inverted L-type capsulotomy was performed over the dorsal aspect of the first metatarsal  phalangeal joint.  The capsular and periosteal structures were dissected free of their osseous attachments and reflected medially and laterally thus exposing the head of the first metatarsal at the operative site.  Utilizing a sagittal saw, the medial prominence was resected and passed from the operative field.  All rough edges were smoothed.    Attention was directed to the first interspace via the original skin incision.  Using sharp and blunt dissection, the fibular sesamoid was freed of its soft tissue attachments proximally, laterally and distally.  The conjoined tendon of the adductor hallucis muscle was identified and transected at its attachment to the base of the proximal phalanx of the hallux.  The lateral contracture present on the hallux was noted to be reduced and the sesamoid apparatus was noted to float into a more corrected medial position.    Attention was redirected to the medial aspect of the first metatarsal head where a through and through chevron osteotomy was created in the metaphyseal region of the first metatarsal bone using a sagittal bone saw.  The apex of the osteotomy pointed distally.  Upon completion of the osteotomy the capital fragment was distracted and shifted laterally into a more corrected position.  A k-wire from the 3.0 mm Synthes cannulated screw set was inserted across the osteotomy site from a dorsal proximal to plantar distal direction.  A 3.0 mm Synthes cannulated screw was inserted across the k-wire.  Adequate compression was noted.  The position of the screw was confirmed with fluoroscopy and noted to be in acceptable alignment.    The remaining medial bone  shelf was resected utilizing a sagittal bone saw and passed from the operative field.  The area was copiously irrigated. The periosteal and capsular tissues were approximated with 2-0 Vicryl suture.  4-0 Vicryl was used to approximate the subcutaneous tissues. 4-0 Vicryl was used to approximate the skin in a  subcuticular manner.  The skin closure was reinforced with Steri-Strips.   Attention was then directed to the dorsolateral aspect of the fifth metatarsal phalangeal joint where a 4-cm linear incision was made.  The incision was deepened through subcutaneous tissues.  Vital neurovascular structures were identified and retracted.  All bleeders were identified and cauterized.  The incision was deepened to the level of the fifth metatarsal phalangeal joint capsule.  A longitudinal capsulotomy was performed over the dorsal aspect of the fifth metatarsal phalangeal joint.  The capsular and periosteal structures were dissected free of their osseous attachments and reflected medially and laterally thus exposing the head of the fifth metatarsal at the operative site.  Utilizing a sagittal saw, the lateral prominence was resected and passed from the operative field.  All rough edges were smoothed.    Attention was redirected to the lateral aspect of the fifth metatarsal head where a through and throughosteotomy was created in the metaphyseal region of the first metatarsal bone using a sagittal bone saw.  Upon completion of the osteotomy the capital fragment was distracted and shifted laterally into a more corrected position.  A k-wire from the 2.0/2.4 mm Synthes cannulated screw set was inserted across the osteotomy site from a dorsal proximal to plantar distal direction.  A 2.0 mm Synthes screw was inserted across the osteotomy.  Adequate compression was noted.  The position of the screw was confirmed with fluoroscopy and noted to be in acceptable alignment.  The k-wire was removed.  The remaining lateral bone shelf was resected utilizing a sagittal bone saw and passed from the operative field.  The area was copiously irrigated. The periosteal and capsular tissues were approximated with 2-0 Vicryl suture.  4-0 Vicryl was used to approximate the subcutaneous tissues. 4-0 Vicryl was used to approximate the skin in a  subcuticular manner.  The skin closure was reinforced with Steri-Strips.   A sterile compressive dressing was applied to the operative foot.  The patient's ankle tourniquet was deflated.  A prompt hyperemic response was noted to all digits of the operative foot.    The patient tolerated the procedure well.  The patient was then transferred to PACU with vital signs stable and vascular status intact to all toes of the operative foot.  Following a period of postoperative monitoring, the patient will be discharged home.

## 2012-02-25 NOTE — Transfer of Care (Signed)
Immediate Anesthesia Transfer of Care Note  Patient: Kaitlyn Franklin  Procedure(s) Performed: Procedure(s) (LRB) with comments: BUNIONECTOMY (Right) - Austin Bunionectomy Right Foot, Tailors Bunionectomy Right Foot  Patient Location: PACU and Short Stay  Anesthesia Type: MAC  Level of Consciousness: awake, alert  and patient cooperative  Airway & Oxygen Therapy: Patient Spontanous Breathing  Post-op Assessment: Report given to PACU RN, Post -op Vital signs reviewed and stable and Patient moving all extremities  Post vital signs: Reviewed and stable  Complications: No apparent anesthesia complications

## 2012-02-25 NOTE — H&P (Signed)
HISTORY AND PHYSICAL INTERVAL NOTE:  02/25/2012  7:25 AM  Kaitlyn Franklin  has presented today for surgery, with the diagnosis of hallux valgus right foot and tailors bunion right foot.  The various methods of treatment have been discussed with the patient.  No guarantees were given.  After consideration of risks, benefits and other options for treatment, the patient has consented to surgery.  I have reviewed the patients' chart and labs.    Patient Vitals for the past 24 hrs:  BP Temp Temp src Pulse Resp SpO2  02/25/12 0715 118/80 mmHg - - - 18  97 %  02/25/12 0710 157/95 mmHg - - - 18  100 %  02/25/12 0705 146/90 mmHg - - - 20  99 %  02/25/12 0700 140/80 mmHg - - - 22  99 %  02/25/12 0655 155/86 mmHg - - - 15  98 %  02/25/12 0650 - - - - - 96 %  02/25/12 0643 155/86 mmHg 98.3 F (36.8 C) Oral 79  19  98 %    A history and physical examination was performed in my office on 02/16/2012.  The patient was reexamined.  There have been no changes to this history and physical examination.  Dallas Schimke, DPM

## 2012-02-25 NOTE — Discharge Instructions (Signed)
Leave the cam walker on at all times.  Only put weight on your heel.

## 2012-02-25 NOTE — Progress Notes (Addendum)
Cam walker placed right foot after portable foot xray taken. Darco postop shoe given for future use.Ice packs to ankle and behind knee right. Right foot elevated on 2 pillows. Dressing dry, intact.

## 2012-02-25 NOTE — Brief Op Note (Signed)
BRIEF OPERATIVE NOTE  SURGEON:   Dallas Schimke, DPM  OR STAFF:   Cyndie Chime, RN - Circulator Maggie Lucile Crater, CST - Scrub Person Lennox Pippins, RN - RN First Assistant Hardin Negus, RN - Careers adviser   PREOPERATIVE DIAGNOSIS:   1.  Hallux valgus right foot 2.  Tailors bunion right foot  POSTOPERATIVE DIAGNOSIS: Same  PROCEDURE: 1.  Austin bunionectomy right foot 2.  Tailors bunionectomy right foot with osteotomy  ANESTHESIA:  Monitor Anesthesia Care   HEMOSTASIS:   Pneumatic ankle tourniquet set at 250 mmHg  ESTIMATED BLOOD LOSS:   Minimal (<5 cc)  MATERIALS USED:  3.0 mm by 20 mm cannulated Synthes screw, 2.0 mm by 14 mm Synthes screw  INJECTABLES: Marcaine 0.5% plain; 20mL  PATHOLOGY:   None  COMPLICATIONS:   None  INDICATIONS:  Painful bunion and tailor's bunion deformity of the right foot non-responsive to conservative care.  DICTATION:  Note written in EPIC

## 2012-03-01 ENCOUNTER — Encounter (HOSPITAL_COMMUNITY): Payer: Self-pay | Admitting: Podiatry

## 2012-06-18 ENCOUNTER — Other Ambulatory Visit: Payer: Self-pay

## 2012-09-08 ENCOUNTER — Ambulatory Visit (HOSPITAL_COMMUNITY)
Admission: RE | Admit: 2012-09-08 | Discharge: 2012-09-08 | Disposition: A | Discharge status: Home / Self Care | Payer: BC Managed Care – PPO | Source: Ambulatory Visit | Attending: Podiatry | Admitting: Podiatry

## 2012-09-08 DIAGNOSIS — M6281 Muscle weakness (generalized): Secondary | ICD-10-CM | POA: Insufficient documentation

## 2012-09-08 DIAGNOSIS — IMO0001 Reserved for inherently not codable concepts without codable children: Secondary | ICD-10-CM | POA: Insufficient documentation

## 2012-09-08 DIAGNOSIS — R29898 Other symptoms and signs involving the musculoskeletal system: Secondary | ICD-10-CM | POA: Insufficient documentation

## 2012-09-08 DIAGNOSIS — M25579 Pain in unspecified ankle and joints of unspecified foot: Secondary | ICD-10-CM | POA: Insufficient documentation

## 2012-09-08 NOTE — Evaluation (Signed)
Physical Therapy Evaluation  Patient Details  Name: Kaitlyn Franklin MRN: [id number] Date of Birth: 01/11/1951  Today's Date: 09/08/2012 Time: 0930-1015 PT Time Calculation (min): 45 min Charges: 1 eval, 15' TE             Visit#: 1 of 1  Re-eval:   Assessment Diagnosis: hallux valgus surgery Surgical Date: 02/25/12 Next MD Visit: Dr. Nolen Mu - may 13 Prior Therapy: None  Past Medical History:  Past Medical History  Diagnosis Date  . Hypertension   . Hypercholesteremia   . Hypothyroidism   . PONV (postoperative nausea and vomiting)    Past Surgical History:  Past Surgical History  Procedure Laterality Date  . Colonoscopy  12/31/2010    Procedure: COLONOSCOPY;  Surgeon: Corbin Ade, MD;  Location: [facility];  Service: Endoscopy;  Laterality: N/A;  10:45 AM  . Bunionectomy  02/25/2012    Procedure: Arbutus Leas;  Surgeon: Dallas Schimke, DPM;  Location: [facility];  Service: Orthopedics;  Laterality: Right;  Austin Bunionectomy Right Foot, Tailors Bunionectomy Right Foot    Subjective Symptoms/Limitations Pertinent History: Pt is referred to PT s/p Rt hallux valgus surgery on 02/25/12 and feels that overall her pain has decreased but feels she is having difficulty with walking normally and feels that her foot is weak.  She suggested PT to her physican who provided her with a referral to PT.  Pain Assessment Currently in Pain?: No/denies  Balance Screening Balance Screen Has the patient fallen in the past 6 months: No Has the patient had a decrease in activity level because of a fear of falling? : No Is the patient reluctant to leave their home because of a fear of falling? : No  Prior Functioning  Prior Function Vocation Requirements: Works for [employer],  Standing and walking all day  Assessment RLE Strength Right Hip Flexion: 4/5 Right Hip Extension: 4/5 Right Hip ABduction: 4/5 Right Hip ADduction: 4/5 Right Knee Flexion: 5/5 Right Knee  Extension: 5/5 Right Ankle Dorsiflexion: 4/5 Right Ankle Plantar Flexion: 3+/5 Right Ankle Inversion: 3+/5 Right Ankle Eversion: 3+/5 Palpation Palpation: fascial restrictions to distal scar  Mobility/Balance  Ambulation/Gait Ambulation/Gait: Yes Gait Pattern: Decreased trunk rotation (Rt hallux flexion on stance) Static Standing Balance Static Standing - Comment/# of Minutes: WNL   Exercise/Treatments Supine Bridges: 5 reps;Limitations Bridges Limitations: with knee advancement Straight Leg Raises: Right;5 reps;Limitations Straight Leg Raises Limitations: 3 sec hold Sidelying Hip ABduction: Right;10 reps Prone  Hip Extension: Both;5 reps Ankle Exercises - Seated Towel Crunch: 1 rep Marble Pickup: 1 rep 15 marbles Heel Raises: 10 reps Toe Raise: 10 reps  Physical Therapy Assessment and Plan PT Assessment and Plan Clinical Impression Statement: Pt is a 62 y.o. female referred to PT for a 1x home exercise program (HEP) for LE strengthening s/p hallux valgus surgery.  Pt was evaluated then was taught and had pt demonstrate HEP. At this time she is independent with her HEP and will f/u with MD on May 13th.  PT Frequency:  (1x visit) PT Plan: D/C    Goals Home Exercise Program Pt will Perform Home Exercise Program: Independently PT Goal: Perform Home Exercise Program - Progress: Met  Problem List Patient Active Problem List   Diagnosis Date Noted  . Ankle weakness 09/08/2012    PT Plan of Care PT Home Exercise Plan: See scanned report PT Patient Instructions: Importance of core,hip, knee and ankle strength to improve gait mechanics and decrease risk of secondary injuries, discussed posture, balance,  scar management and answered remaining questions about diagnosis.  Consulted and Agree with Plan of Care: Patient  GP    Annett Fabian, MPT, ATC 09/08/2012, 10:21 AM  Physician Documentation Your signature is required to indicate approval of the treatment plan as stated  above.  Please sign and either send electronically or make a copy of this report for your files and return this physician signed original.   Please mark one 1.__approve of plan  2. ___approve of plan with the following conditions.   ______________________________                                                          _____________________ Physician Signature                                                                                                             Date

## 2013-03-09 ENCOUNTER — Other Ambulatory Visit: Payer: Self-pay

## 2015-07-09 ENCOUNTER — Other Ambulatory Visit (HOSPITAL_COMMUNITY): Payer: Self-pay | Admitting: Internal Medicine

## 2015-07-09 DIAGNOSIS — Z1231 Encounter for screening mammogram for malignant neoplasm of breast: Secondary | ICD-10-CM

## 2015-07-11 ENCOUNTER — Ambulatory Visit (HOSPITAL_COMMUNITY)
Admission: RE | Admit: 2015-07-11 | Discharge: 2015-07-11 | Disposition: A | Discharge status: Home / Self Care | Payer: 59 | Source: Ambulatory Visit | Attending: Internal Medicine | Admitting: Internal Medicine

## 2015-07-11 DIAGNOSIS — Z1231 Encounter for screening mammogram for malignant neoplasm of breast: Secondary | ICD-10-CM | POA: Insufficient documentation

## 2015-12-05 ENCOUNTER — Encounter: Payer: Self-pay | Admitting: Internal Medicine

## 2016-01-21 ENCOUNTER — Encounter: Payer: Self-pay | Admitting: Nurse Practitioner

## 2016-01-21 ENCOUNTER — Ambulatory Visit (INDEPENDENT_AMBULATORY_CARE_PROVIDER_SITE_OTHER): Payer: 59 | Admitting: Nurse Practitioner

## 2016-01-21 ENCOUNTER — Other Ambulatory Visit: Payer: Self-pay

## 2016-01-21 DIAGNOSIS — R112 Nausea with vomiting, unspecified: Secondary | ICD-10-CM

## 2016-01-21 DIAGNOSIS — Z86010 Personal history of colonic polyps: Secondary | ICD-10-CM | POA: Diagnosis not present

## 2016-01-21 DIAGNOSIS — Z9889 Other specified postprocedural states: Secondary | ICD-10-CM | POA: Diagnosis not present

## 2016-01-21 MED ORDER — NA SULFATE-K SULFATE-MG SULF 17.5-3.13-1.6 GM/177ML PO SOLN: 1.0000 | ORAL | 0 refills | Status: DC

## 2016-01-21 NOTE — Assessment & Plan Note (Signed)
Patient with a history of colon polyps. Previous colonoscopy found sessile serrated adenoma in the hepatic flexure. Recommended 5 year repeat colonoscopy. She is generally asymptomatic from a GI standpoint. We'll proceed with previously recommended colonoscopy.  Proceed with TCS with Dr. Gala Romney in near future: the risks, benefits, and alternatives have been discussed with the patient in detail. The patient states understanding and desires to proceed.  The patient is not on any anticoagulants, anxiolytics, chronic pain medications, or antidepressants. Denies alcohol and drug use. Conscious sedation should be adequate for her procedure as it was for her last.

## 2016-01-21 NOTE — Patient Instructions (Addendum)
1. We will schedule your procedure for you. 2. Let them know when you arrived to have nausea and vomiting after sedation medicine. 3. Return for follow-up based on postprocedure recommendations.

## 2016-01-21 NOTE — Assessment & Plan Note (Signed)
History of postoperative nausea and vomiting. During her last procedure when she became more aware she started having significant nausea. We will make a note on the schedulers sheet for that to be made aware for her procedures to prepare with antiemetics accordingly.

## 2016-01-21 NOTE — Progress Notes (Signed)
cc'ed to pcp °

## 2016-01-21 NOTE — Progress Notes (Signed)
Primary Care Physician:  Wende Neighbors, MD Primary Gastroenterologist:  Dr. Gala Romney  Chief Complaint  Patient presents with  . Colonoscopy    hx polyps-due for 5 yr    HPI:   Kaitlyn Franklin is a 65 y.o. female who presents to schedule 5 year repeat colonoscopy with a history of colon polyps. Last colonoscopy completed 12/31/2010 for first-ever average risk screening colonoscopy. This completed on conscious sedation. Findings include colonic diverticulosis, 8 mm pedunculated hepatic flexure polyp status post polypectomy. Surgical pathology found the polyp to be sessile serrated adenoma. Recommended repeat colonoscopy in 5 years.  Today she states she's doing well overall. Denies abdominal pain, N/V, fever, chills, unintentional weight loss, hematochezia, melena, changes in bowel habits. Denies chest pain, dyspnea, dizziness, lightheadedness, syncope, near syncope. Denies any other upper or lower GI symptoms.  Past Medical History:  Diagnosis Date  . Hypercholesteremia   . Hypertension   . Hypothyroidism   . PONV (postoperative nausea and vomiting)     Past Surgical History:  Procedure Laterality Date  . BUNIONECTOMY  02/25/2012   Procedure: Lillard Anes;  Surgeon: Marcheta Grammes, DPM;  Location: [facility];  Service: Orthopedics;  Laterality: Right;  Austin Bunionectomy Right Foot, Tailors Bunionectomy Right Foot  . COLONOSCOPY  12/31/2010   Procedure: COLONOSCOPY;  Surgeon: Daneil Dolin, MD;  Location: [facility];  Service: Endoscopy;  Laterality: N/A;  10:45 AM    Current Outpatient Prescriptions  Medication Sig Dispense Refill  . aspirin 81 MG tablet Take 81 mg by mouth daily.      . Aspirin-Acetaminophen-Caffeine (EXCEDRIN EXTRA STRENGTH PO) Take 2 tablets by mouth as needed.    . Calcium Carbonate-Vitamin D (CALCIUM 600 + D PO) Take 1 tablet by mouth 2 (two) times daily.    . Multiple Vitamin (MULTIVITAMIN) tablet Take 1 tablet by mouth daily.      . rosuvastatin  (CRESTOR) 5 MG tablet Take 5 mg by mouth daily.      Marland Kitchen SYNTHROID 112 MCG tablet Take 1 tablet by mouth daily.    Marland Kitchen telmisartan-hydrochlorothiazide (MICARDIS HCT) 40-12.5 MG per tablet Take 1 tablet by mouth daily.      . vitamin A 8000 UNIT capsule Take 8,000 Units by mouth daily.      Marland Kitchen acetaminophen (TYLENOL) 500 MG tablet Take 1,000 mg by mouth every 6 (six) hours as needed. Headache.    . fish oil-omega-3 fatty acids 1000 MG capsule Take 1 g by mouth daily.      Marland Kitchen levothyroxine (SYNTHROID, LEVOTHROID) 75 MCG tablet Take 75 mcg by mouth daily.       No current facility-administered medications for this visit.     Allergies as of 01/21/2016  . (No Known Allergies)    No family history on file.  Social History   Social History  . Marital status: Married    Spouse name: N/A  . Number of children: N/A  . Years of education: N/A   Occupational History  . Not on file.   Social History Main Topics  . Smoking status: Never Smoker  . Smokeless tobacco: Not on file  . Alcohol use No  . Drug use: No  . Sexual activity: Yes    Birth control/ protection: Post-menopausal   Other Topics Concern  . Not on file   Social History Narrative  . No narrative on file    Review of Systems: General: Negative for anorexia, weight loss, fever, chills, fatigue, weakness. ENT: Negative for  hoarseness, difficulty swallowing. CV: Negative for chest pain, angina, palpitations, peripheral edema.  Respiratory: Negative for dyspnea at rest, cough, sputum, wheezing.  GI: See history of present illness. MS: Negative for joint pain, low back pain.  Derm: Negative for rash or itching.  Endo: Negative for unusual weight change.  Heme: Negative for bruising or bleeding. Allergy: Negative for rash or hives.    Physical Exam: BP 129/83   Pulse 81   Temp 97.9 F (36.6 C) (Oral)   Ht 5\' 4"  (1.626 m)   Wt 201 lb 9.6 oz (91.4 kg)   BMI 34.60 kg/m  General:   Obese female, alert and oriented.  Pleasant and cooperative. Well-nourished and well-developed.  Head:  Normocephalic and atraumatic. Eyes:  Without icterus, sclera clear and conjunctiva pink.  Ears:  Normal auditory acuity. Cardiovascular:  S1, S2 present without murmurs appreciated. Extremities without clubbing or edema. Respiratory:  Clear to auscultation bilaterally. No wheezes, rales, or rhonchi. No distress.  Gastrointestinal:  +BS, rounded but soft, non-tender and non-distended. No HSM noted. No guarding or rebound. No masses appreciated.  Rectal:  Deferred  Musculoskalatal:  Symmetrical without gross deformities. Neurologic:  Alert and oriented x4;  grossly normal neurologically. Psych:  Alert and cooperative. Normal mood and affect. Heme/Lymph/Immune: No excessive bruising noted.    01/21/2016 8:24 AM   Disclaimer: This note was dictated with voice recognition software. Similar sounding words can inadvertently be transcribed and may not be corrected upon review.

## 2016-01-29 ENCOUNTER — Encounter (HOSPITAL_COMMUNITY): Payer: Self-pay | Admitting: *Deleted

## 2016-01-29 ENCOUNTER — Ambulatory Visit (HOSPITAL_COMMUNITY)
Admission: RE | Admit: 2016-01-29 | Discharge: 2016-01-29 | Disposition: A | Discharge status: Home / Self Care | Payer: 59 | Source: Ambulatory Visit | Attending: Internal Medicine | Admitting: Internal Medicine

## 2016-01-29 ENCOUNTER — Encounter (HOSPITAL_COMMUNITY)
Admission: RE | Disposition: A | Discharge status: Home / Self Care | Payer: Self-pay | Source: Ambulatory Visit | Attending: Internal Medicine

## 2016-01-29 DIAGNOSIS — Z79899 Other long term (current) drug therapy: Secondary | ICD-10-CM | POA: Insufficient documentation

## 2016-01-29 DIAGNOSIS — I10 Essential (primary) hypertension: Secondary | ICD-10-CM | POA: Insufficient documentation

## 2016-01-29 DIAGNOSIS — Z7982 Long term (current) use of aspirin: Secondary | ICD-10-CM | POA: Diagnosis not present

## 2016-01-29 DIAGNOSIS — K5730 Diverticulosis of large intestine without perforation or abscess without bleeding: Secondary | ICD-10-CM | POA: Insufficient documentation

## 2016-01-29 DIAGNOSIS — Z86010 Personal history of colonic polyps: Secondary | ICD-10-CM | POA: Insufficient documentation

## 2016-01-29 DIAGNOSIS — Z1211 Encounter for screening for malignant neoplasm of colon: Secondary | ICD-10-CM | POA: Diagnosis not present

## 2016-01-29 DIAGNOSIS — E7800 Pure hypercholesterolemia, unspecified: Secondary | ICD-10-CM | POA: Insufficient documentation

## 2016-01-29 DIAGNOSIS — E039 Hypothyroidism, unspecified: Secondary | ICD-10-CM | POA: Diagnosis not present

## 2016-01-29 HISTORY — PX: COLONOSCOPY: SHX5424

## 2016-01-29 SURGERY — COLONOSCOPY
Anesthesia: Moderate Sedation

## 2016-01-29 MED ORDER — SODIUM CHLORIDE 0.9 % IV SOLN
INTRAVENOUS | Status: DC
  Administered 2016-01-29: 09:00:00 via INTRAVENOUS

## 2016-01-29 MED ORDER — MEPERIDINE HCL 100 MG/ML IJ SOLN
INTRAMUSCULAR | Status: DC | PRN
  Administered 2016-01-29 (×2): 25 mg via INTRAVENOUS
  Administered 2016-01-29: 50 mg via INTRAVENOUS

## 2016-01-29 MED ORDER — MIDAZOLAM HCL 5 MG/5ML IJ SOLN
INTRAMUSCULAR | Status: DC | PRN
  Administered 2016-01-29 (×2): 1 mg via INTRAVENOUS
  Administered 2016-01-29: 2 mg via INTRAVENOUS
  Administered 2016-01-29: 1 mg via INTRAVENOUS

## 2016-01-29 MED ORDER — ONDANSETRON HCL 4 MG/2ML IJ SOLN
INTRAMUSCULAR | Status: AC
  Filled 2016-01-29: qty 2

## 2016-01-29 MED ORDER — ONDANSETRON HCL 4 MG/2ML IJ SOLN
INTRAMUSCULAR | Status: DC | PRN
  Administered 2016-01-29: 4 mg via INTRAVENOUS

## 2016-01-29 MED ORDER — STERILE WATER FOR IRRIGATION IR SOLN
Status: DC | PRN
  Administered 2016-01-29: 2.5 mL

## 2016-01-29 MED ORDER — MEPERIDINE HCL 100 MG/ML IJ SOLN
INTRAMUSCULAR | Status: AC
  Filled 2016-01-29: qty 2

## 2016-01-29 MED ORDER — MIDAZOLAM HCL 5 MG/5ML IJ SOLN
INTRAMUSCULAR | Status: AC
  Filled 2016-01-29: qty 10

## 2016-01-29 NOTE — H&P (View-Only) (Signed)
Primary Care Physician:  Wende Neighbors, MD Primary Gastroenterologist:  Dr. Gala Romney  Chief Complaint  Patient presents with  . Colonoscopy    hx polyps-due for 5 yr    HPI:   Kaitlyn Franklin is a 65 y.o. female who presents to schedule 5 year repeat colonoscopy with a history of colon polyps. Last colonoscopy completed 12/31/2010 for first-ever average risk screening colonoscopy. This completed on conscious sedation. Findings include colonic diverticulosis, 8 mm pedunculated hepatic flexure polyp status post polypectomy. Surgical pathology found the polyp to be sessile serrated adenoma. Recommended repeat colonoscopy in 5 years.  Today she states she's doing well overall. Denies abdominal pain, N/V, fever, chills, unintentional weight loss, hematochezia, melena, changes in bowel habits. Denies chest pain, dyspnea, dizziness, lightheadedness, syncope, near syncope. Denies any other upper or lower GI symptoms.  Past Medical History:  Diagnosis Date  . Hypercholesteremia   . Hypertension   . Hypothyroidism   . PONV (postoperative nausea and vomiting)     Past Surgical History:  Procedure Laterality Date  . BUNIONECTOMY  02/25/2012   Procedure: Lillard Anes;  Surgeon: Marcheta Grammes, DPM;  Location: [facility];  Service: Orthopedics;  Laterality: Right;  Austin Bunionectomy Right Foot, Tailors Bunionectomy Right Foot  . COLONOSCOPY  12/31/2010   Procedure: COLONOSCOPY;  Surgeon: Daneil Dolin, MD;  Location: [facility];  Service: Endoscopy;  Laterality: N/A;  10:45 AM    Current Outpatient Prescriptions  Medication Sig Dispense Refill  . aspirin 81 MG tablet Take 81 mg by mouth daily.      . Aspirin-Acetaminophen-Caffeine (EXCEDRIN EXTRA STRENGTH PO) Take 2 tablets by mouth as needed.    . Calcium Carbonate-Vitamin D (CALCIUM 600 + D PO) Take 1 tablet by mouth 2 (two) times daily.    . Multiple Vitamin (MULTIVITAMIN) tablet Take 1 tablet by mouth daily.      . rosuvastatin  (CRESTOR) 5 MG tablet Take 5 mg by mouth daily.      Marland Kitchen SYNTHROID 112 MCG tablet Take 1 tablet by mouth daily.    Marland Kitchen telmisartan-hydrochlorothiazide (MICARDIS HCT) 40-12.5 MG per tablet Take 1 tablet by mouth daily.      . vitamin A 8000 UNIT capsule Take 8,000 Units by mouth daily.      Marland Kitchen acetaminophen (TYLENOL) 500 MG tablet Take 1,000 mg by mouth every 6 (six) hours as needed. Headache.    . fish oil-omega-3 fatty acids 1000 MG capsule Take 1 g by mouth daily.      Marland Kitchen levothyroxine (SYNTHROID, LEVOTHROID) 75 MCG tablet Take 75 mcg by mouth daily.       No current facility-administered medications for this visit.     Allergies as of 01/21/2016  . (No Known Allergies)    No family history on file.  Social History   Social History  . Marital status: Married    Spouse name: N/A  . Number of children: N/A  . Years of education: N/A   Occupational History  . Not on file.   Social History Main Topics  . Smoking status: Never Smoker  . Smokeless tobacco: Not on file  . Alcohol use No  . Drug use: No  . Sexual activity: Yes    Birth control/ protection: Post-menopausal   Other Topics Concern  . Not on file   Social History Narrative  . No narrative on file    Review of Systems: General: Negative for anorexia, weight loss, fever, chills, fatigue, weakness. ENT: Negative for  hoarseness, difficulty swallowing. CV: Negative for chest pain, angina, palpitations, peripheral edema.  Respiratory: Negative for dyspnea at rest, cough, sputum, wheezing.  GI: See history of present illness. MS: Negative for joint pain, low back pain.  Derm: Negative for rash or itching.  Endo: Negative for unusual weight change.  Heme: Negative for bruising or bleeding. Allergy: Negative for rash or hives.    Physical Exam: BP 129/83   Pulse 81   Temp 97.9 F (36.6 C) (Oral)   Ht 5\' 4"  (1.626 m)   Wt 201 lb 9.6 oz (91.4 kg)   BMI 34.60 kg/m  General:   Obese female, alert and oriented.  Pleasant and cooperative. Well-nourished and well-developed.  Head:  Normocephalic and atraumatic. Eyes:  Without icterus, sclera clear and conjunctiva pink.  Ears:  Normal auditory acuity. Cardiovascular:  S1, S2 present without murmurs appreciated. Extremities without clubbing or edema. Respiratory:  Clear to auscultation bilaterally. No wheezes, rales, or rhonchi. No distress.  Gastrointestinal:  +BS, rounded but soft, non-tender and non-distended. No HSM noted. No guarding or rebound. No masses appreciated.  Rectal:  Deferred  Musculoskalatal:  Symmetrical without gross deformities. Neurologic:  Alert and oriented x4;  grossly normal neurologically. Psych:  Alert and cooperative. Normal mood and affect. Heme/Lymph/Immune: No excessive bruising noted.    01/21/2016 8:24 AM   Disclaimer: This note was dictated with voice recognition software. Similar sounding words can inadvertently be transcribed and may not be corrected upon review.

## 2016-01-29 NOTE — Interval H&P Note (Signed)
History and Physical Interval Note:  01/29/2016 9:36 AM  Kaitlyn Franklin  has presented today for surgery, with the diagnosis of history polyps  The various methods of treatment have been discussed with the patient and family. After consideration of risks, benefits and other options for treatment, the patient has consented to  Procedure(s) with comments: COLONOSCOPY (N/A) - 9:30 AM as a surgical intervention .  The patient's history has been reviewed, patient examined, no change in status, stable for surgery.  I have reviewed the patient's chart and labs.  Questions were answered to the patient's satisfaction.     No change. Surveillance colonoscopy per plan.The risks, benefits, limitations, alternatives and imponderables have been reviewed with the patient. Questions have been answered. All parties are agreeable.   Manus Rudd

## 2016-01-29 NOTE — Op Note (Signed)
[facility] Patient Name: Kaitlyn Franklin Procedure Date: 01/29/2016 9:30 AM MRN: [id number] Date of Birth: [birth year] Attending MD: Norvel Richards , MD CSN: [id number] Age: 65 Admit Type: Outpatient Procedure:                Colonoscopy with snare polypectomy Indications:              High risk colon cancer surveillance: Personal                            history of colonic polyps Providers:                Norvel Richards, MD, Lurline Del, RN, Randa Spike, Technician Referring MD:              Medicines:                Midazolam 5 mg IV, Meperidine 123XX123 mg IV Complications:            No immediate complications. Estimated Blood Loss:     Estimated blood loss: none. Procedure:                Pre-Anesthesia Assessment:                           - Prior to the procedure, a History and Physical                            was performed, and patient medications and                            allergies were reviewed. The patient's tolerance of                            previous anesthesia was also reviewed. The risks                            and benefits of the procedure and the sedation                            options and risks were discussed with the patient.                            All questions were answered, and informed consent                            was obtained. Prior Anticoagulants: The patient has                            taken no previous anticoagulant or antiplatelet                            agents. ASA Grade Assessment: II - A patient with  mild systemic disease. After reviewing the risks                            and benefits, the patient was deemed in                            satisfactory condition to undergo the procedure.                           After obtaining informed consent, the colonoscope                            was passed under direct vision. Throughout the               procedure, the patient's blood pressure, pulse, and                            oxygen saturations were monitored continuously. The                            [id number] scope was introduced through                            the anus and advanced to the the cecum, identified                            by appendiceal orifice and ileocecal valve. The                            ileocecal valve, appendiceal orifice, and rectum                            were photographed. The entire colon was well                            visualized. The colonoscopy was performed without                            difficulty. The patient tolerated the procedure                            well. The quality of the bowel preparation was                            adequate. Scope In: [id number] Scope Out: 10:04:36 AM Scope Withdrawal Time: 0 hours 8 minutes 33 seconds  Total Procedure Duration: 0 hours 17 minutes 37 seconds  Findings:      The perianal and digital rectal examinations were normal.      Scattered small and large-mouthed diverticula were found in the sigmoid       colon and descending colon.      Somewhat tortuous redundant colon requiring external abdominal pressure       to reach cecum.      The exam was otherwise without abnormality on direct and retroflexion  views. Impression:               - Diverticulosis in the sigmoid colon and in the                            descending colon.                           - The examination was otherwise normal on direct                            and retroflexion views.                           - No specimens collected. Moderate Sedation:      Moderate (conscious) sedation was administered by the endoscopy nurse       and supervised by the endoscopist. The following parameters were       monitored: oxygen saturation, heart rate, blood pressure, respiratory       rate, EKG, adequacy of pulmonary ventilation, and response to  care.       Total physician intraservice time was 26 minutes. Recommendation:           - Patient has a contact number available for                            emergencies. The signs and symptoms of potential                            delayed complications were discussed with the                            patient. Return to normal activities tomorrow.                            Written discharge instructions were provided to the                            patient.                           - Advance diet as tolerated.                           - Continue present medications.                           - Repeat colonoscopy in 5 years for surveillance.                           - Return to GI clinic PRN. Procedure Code(s):        --- Professional ---                           [phone], Colonoscopy, flexible; diagnostic, including  collection of specimen(s) by brushing or washing,                            when performed (separate procedure)                           99152, Moderate sedation services provided by the                            same physician or other qualified health care                            professional performing the diagnostic or                            therapeutic service that the sedation supports,                            requiring the presence of an independent trained                            observer to assist in the monitoring of the                            patient's level of consciousness and physiological                            status; initial 15 minutes of intraservice time,                            patient age 87 years or older                           [phone], Moderate sedation services; each additional                            15 minutes intraservice time Diagnosis Code(s):        --- Professional ---                           Z86.010, Personal history of colonic polyps                           K57.30,  Diverticulosis of large intestine without                            perforation or abscess without bleeding CPT copyright 2016 American Medical Association. All rights reserved. The codes documented in this report are preliminary and upon coder review may  be revised to meet current compliance requirements. Cristopher Estimable. Nashae Maudlin, MD Norvel Richards, MD 01/29/2016 10:12:17 AM This report has been signed electronically. Number of Addenda: 0

## 2016-01-29 NOTE — Discharge Instructions (Signed)
Colonoscopy Discharge Instructions  Read the instructions outlined below and refer to this sheet in the next few weeks. These discharge instructions provide you with general information on caring for yourself after you leave the hospital. Your doctor may also give you specific instructions. While your treatment has been planned according to the most current medical practices available, unavoidable complications occasionally occur. If you have any problems or questions after discharge, call Dr. Gala Romney at [phone]. ACTIVITY  You may resume your regular activity, but move at a slower pace for the next 24 hours.   Take frequent rest periods for the next 24 hours.   Walking will help get rid of the air and reduce the bloated feeling in your belly (abdomen).   No driving for 24 hours (because of the medicine (anesthesia) used during the test).    Do not sign any important legal documents or operate any machinery for 24 hours (because of the anesthesia used during the test).  NUTRITION  Drink plenty of fluids.   You may resume your normal diet as instructed by your doctor.   Begin with a light meal and progress to your normal diet. Heavy or fried foods are harder to digest and may make you feel sick to your stomach (nauseated).   Avoid alcoholic beverages for 24 hours or as instructed.  MEDICATIONS  You may resume your normal medications unless your doctor tells you otherwise.  WHAT YOU CAN EXPECT TODAY  Some feelings of bloating in the abdomen.   Passage of more gas than usual.   Spotting of blood in your stool or on the toilet paper.  IF YOU HAD POLYPS REMOVED DURING THE COLONOSCOPY:  No aspirin products for 7 days or as instructed.   No alcohol for 7 days or as instructed.   Eat a soft diet for the next 24 hours.  FINDING OUT THE RESULTS OF YOUR TEST Not all test results are available during your visit. If your test results are not back during the visit, make an appointment  with your caregiver to find out the results. Do not assume everything is normal if you have not heard from your caregiver or the medical facility. It is important for you to follow up on all of your test results.  SEEK IMMEDIATE MEDICAL ATTENTION IF:  You have more than a spotting of blood in your stool.   Your belly is swollen (abdominal distention).   You are nauseated or vomiting.   You have a temperature over 101.   You have abdominal pain or discomfort that is severe or gets worse throughout the day.    Diverticulosis information provided  Repeat colonoscopy in 5 years   Diverticulosis Diverticulosis is the condition that develops when small pouches (diverticula) form in the wall of your colon. Your colon, or large intestine, is where water is absorbed and stool is formed. The pouches form when the inside layer of your colon pushes through weak spots in the outer layers of your colon. CAUSES  No one knows exactly what causes diverticulosis. RISK FACTORS  Being older than 43. Your risk for this condition increases with age. Diverticulosis is rare in people younger than 40 years. By age 42, almost everyone has it.  Eating a low-fiber diet.  Being frequently constipated.  Being overweight.  Not getting enough exercise.  Smoking.  Taking over-the-counter pain medicines, like aspirin and ibuprofen. SYMPTOMS  Most people with diverticulosis do not have symptoms. DIAGNOSIS  Because diverticulosis often has no symptoms,  health care providers often discover the condition during an exam for other colon problems. In many cases, a health care provider will diagnose diverticulosis while using a flexible scope to examine the colon (colonoscopy). TREATMENT  If you have never developed an infection related to diverticulosis, you may not need treatment. If you have had an infection before, treatment may include:  Eating more fruits, vegetables, and grains.  Taking a fiber  supplement.  Taking a live bacteria supplement (probiotic).  Taking medicine to relax your colon. HOME CARE INSTRUCTIONS   Drink at least 6-8 glasses of water each day to prevent constipation.  Try not to strain when you have a bowel movement.  Keep all follow-up appointments. If you have had an infection before:  Increase the fiber in your diet as directed by your health care provider or dietitian.  Take a dietary fiber supplement if your health care provider approves.  Only take medicines as directed by your health care provider. SEEK MEDICAL CARE IF:   You have abdominal pain.  You have bloating.  You have cramps.  You have not gone to the bathroom in 3 days. SEEK IMMEDIATE MEDICAL CARE IF:   Your pain gets worse.  Yourbloating becomes very bad.  You have a fever or chills, and your symptoms suddenly get worse.  You begin vomiting.  You have bowel movements that are bloody or black. MAKE SURE YOU:  Understand these instructions.  Will watch your condition.  Will get help right away if you are not doing well or get worse.   This information is not intended to replace advice given to you by your health care provider. Make sure you discuss any questions you have with your health care provider.   Document Released: 01/16/2004 Document Revised: 04/25/2013 Document Reviewed: 03/15/2013 Elsevier Interactive Patient Education [employer].

## 2016-02-03 ENCOUNTER — Encounter (HOSPITAL_COMMUNITY): Payer: Self-pay | Admitting: Internal Medicine

## 2016-08-04 ENCOUNTER — Other Ambulatory Visit (HOSPITAL_COMMUNITY): Payer: Self-pay | Admitting: Internal Medicine

## 2016-08-04 DIAGNOSIS — Z780 Asymptomatic menopausal state: Secondary | ICD-10-CM

## 2016-08-04 DIAGNOSIS — Z1231 Encounter for screening mammogram for malignant neoplasm of breast: Secondary | ICD-10-CM

## 2016-08-17 ENCOUNTER — Other Ambulatory Visit (HOSPITAL_COMMUNITY): Payer: 59

## 2016-08-17 ENCOUNTER — Ambulatory Visit (HOSPITAL_COMMUNITY): Payer: 59

## 2016-08-26 ENCOUNTER — Other Ambulatory Visit (HOSPITAL_COMMUNITY): Payer: 59

## 2016-08-26 ENCOUNTER — Ambulatory Visit (HOSPITAL_COMMUNITY)
Admission: RE | Admit: 2016-08-26 | Discharge: 2016-08-26 | Disposition: A | Discharge status: Home / Self Care | Payer: BLUE CROSS/BLUE SHIELD | Source: Ambulatory Visit | Attending: Internal Medicine | Admitting: Internal Medicine

## 2016-08-26 DIAGNOSIS — M85852 Other specified disorders of bone density and structure, left thigh: Secondary | ICD-10-CM | POA: Insufficient documentation

## 2016-08-26 DIAGNOSIS — Z1231 Encounter for screening mammogram for malignant neoplasm of breast: Secondary | ICD-10-CM | POA: Diagnosis not present

## 2016-08-26 DIAGNOSIS — Z79899 Other long term (current) drug therapy: Secondary | ICD-10-CM | POA: Diagnosis not present

## 2016-08-26 DIAGNOSIS — Z780 Asymptomatic menopausal state: Secondary | ICD-10-CM | POA: Diagnosis not present

## 2018-01-31 ENCOUNTER — Other Ambulatory Visit (HOSPITAL_COMMUNITY): Payer: Self-pay | Admitting: Internal Medicine

## 2018-01-31 DIAGNOSIS — Z1231 Encounter for screening mammogram for malignant neoplasm of breast: Secondary | ICD-10-CM

## 2018-02-03 ENCOUNTER — Encounter (HOSPITAL_COMMUNITY): Payer: Self-pay

## 2018-02-03 ENCOUNTER — Ambulatory Visit (HOSPITAL_COMMUNITY)
Admission: RE | Admit: 2018-02-03 | Discharge: 2018-02-03 | Disposition: A | Discharge status: Home / Self Care | Payer: BLUE CROSS/BLUE SHIELD | Source: Ambulatory Visit | Attending: Internal Medicine | Admitting: Internal Medicine

## 2018-02-03 DIAGNOSIS — Z1231 Encounter for screening mammogram for malignant neoplasm of breast: Secondary | ICD-10-CM

## 2018-06-04 DIAGNOSIS — K0501 Acute gingivitis, non-plaque induced: Secondary | ICD-10-CM | POA: Diagnosis not present

## 2018-06-04 DIAGNOSIS — K122 Cellulitis and abscess of mouth: Secondary | ICD-10-CM | POA: Diagnosis not present

## 2018-08-11 DIAGNOSIS — I10 Essential (primary) hypertension: Secondary | ICD-10-CM | POA: Diagnosis not present

## 2018-08-11 DIAGNOSIS — E782 Mixed hyperlipidemia: Secondary | ICD-10-CM | POA: Diagnosis not present

## 2018-08-11 DIAGNOSIS — E039 Hypothyroidism, unspecified: Secondary | ICD-10-CM | POA: Diagnosis not present

## 2018-08-16 DIAGNOSIS — I10 Essential (primary) hypertension: Secondary | ICD-10-CM | POA: Diagnosis not present

## 2018-08-16 DIAGNOSIS — E782 Mixed hyperlipidemia: Secondary | ICD-10-CM | POA: Diagnosis not present

## 2018-08-16 DIAGNOSIS — E039 Hypothyroidism, unspecified: Secondary | ICD-10-CM | POA: Diagnosis not present

## 2018-08-16 DIAGNOSIS — Z0001 Encounter for general adult medical examination with abnormal findings: Secondary | ICD-10-CM | POA: Diagnosis not present

## 2019-01-27 DIAGNOSIS — E039 Hypothyroidism, unspecified: Secondary | ICD-10-CM | POA: Diagnosis not present

## 2019-01-27 DIAGNOSIS — E782 Mixed hyperlipidemia: Secondary | ICD-10-CM | POA: Diagnosis not present

## 2019-01-27 DIAGNOSIS — I10 Essential (primary) hypertension: Secondary | ICD-10-CM | POA: Diagnosis not present

## 2019-02-06 DIAGNOSIS — E782 Mixed hyperlipidemia: Secondary | ICD-10-CM | POA: Diagnosis not present

## 2019-02-06 DIAGNOSIS — E039 Hypothyroidism, unspecified: Secondary | ICD-10-CM | POA: Diagnosis not present

## 2019-02-06 DIAGNOSIS — I10 Essential (primary) hypertension: Secondary | ICD-10-CM | POA: Diagnosis not present

## 2019-02-07 DIAGNOSIS — E039 Hypothyroidism, unspecified: Secondary | ICD-10-CM | POA: Diagnosis not present

## 2019-02-07 DIAGNOSIS — I10 Essential (primary) hypertension: Secondary | ICD-10-CM | POA: Diagnosis not present

## 2019-02-07 DIAGNOSIS — E782 Mixed hyperlipidemia: Secondary | ICD-10-CM | POA: Diagnosis not present

## 2019-02-14 DIAGNOSIS — Z23 Encounter for immunization: Secondary | ICD-10-CM | POA: Diagnosis not present

## 2019-02-14 DIAGNOSIS — I10 Essential (primary) hypertension: Secondary | ICD-10-CM | POA: Diagnosis not present

## 2019-02-14 DIAGNOSIS — E782 Mixed hyperlipidemia: Secondary | ICD-10-CM | POA: Diagnosis not present

## 2019-02-14 DIAGNOSIS — E039 Hypothyroidism, unspecified: Secondary | ICD-10-CM | POA: Diagnosis not present

## 2019-04-14 DIAGNOSIS — E782 Mixed hyperlipidemia: Secondary | ICD-10-CM | POA: Diagnosis not present

## 2019-04-14 DIAGNOSIS — I10 Essential (primary) hypertension: Secondary | ICD-10-CM | POA: Diagnosis not present

## 2019-07-14 DIAGNOSIS — E782 Mixed hyperlipidemia: Secondary | ICD-10-CM | POA: Diagnosis not present

## 2019-07-14 DIAGNOSIS — I10 Essential (primary) hypertension: Secondary | ICD-10-CM | POA: Diagnosis not present

## 2019-08-21 DIAGNOSIS — I10 Essential (primary) hypertension: Secondary | ICD-10-CM | POA: Diagnosis not present

## 2019-08-21 DIAGNOSIS — E039 Hypothyroidism, unspecified: Secondary | ICD-10-CM | POA: Diagnosis not present

## 2019-08-21 DIAGNOSIS — E782 Mixed hyperlipidemia: Secondary | ICD-10-CM | POA: Diagnosis not present

## 2019-08-28 DIAGNOSIS — Z0001 Encounter for general adult medical examination with abnormal findings: Secondary | ICD-10-CM | POA: Diagnosis not present

## 2019-08-28 DIAGNOSIS — I10 Essential (primary) hypertension: Secondary | ICD-10-CM | POA: Diagnosis not present

## 2019-08-28 DIAGNOSIS — E782 Mixed hyperlipidemia: Secondary | ICD-10-CM | POA: Diagnosis not present

## 2019-08-28 DIAGNOSIS — E039 Hypothyroidism, unspecified: Secondary | ICD-10-CM | POA: Diagnosis not present

## 2019-10-11 DIAGNOSIS — I10 Essential (primary) hypertension: Secondary | ICD-10-CM | POA: Diagnosis not present

## 2019-10-11 DIAGNOSIS — E782 Mixed hyperlipidemia: Secondary | ICD-10-CM | POA: Diagnosis not present

## 2019-10-11 DIAGNOSIS — E039 Hypothyroidism, unspecified: Secondary | ICD-10-CM | POA: Diagnosis not present

## 2020-01-03 DIAGNOSIS — U071 COVID-19: Secondary | ICD-10-CM | POA: Diagnosis not present

## 2020-01-09 DIAGNOSIS — U071 COVID-19: Secondary | ICD-10-CM | POA: Diagnosis not present

## 2020-01-15 DIAGNOSIS — U071 COVID-19: Secondary | ICD-10-CM | POA: Diagnosis not present

## 2020-02-01 DIAGNOSIS — E039 Hypothyroidism, unspecified: Secondary | ICD-10-CM | POA: Diagnosis not present

## 2020-02-01 DIAGNOSIS — I10 Essential (primary) hypertension: Secondary | ICD-10-CM | POA: Diagnosis not present

## 2020-02-01 DIAGNOSIS — E782 Mixed hyperlipidemia: Secondary | ICD-10-CM | POA: Diagnosis not present

## 2020-02-22 DIAGNOSIS — M25561 Pain in right knee: Secondary | ICD-10-CM | POA: Diagnosis not present

## 2020-02-22 DIAGNOSIS — K122 Cellulitis and abscess of mouth: Secondary | ICD-10-CM | POA: Diagnosis not present

## 2020-02-22 DIAGNOSIS — U071 COVID-19: Secondary | ICD-10-CM | POA: Diagnosis not present

## 2020-02-22 DIAGNOSIS — I10 Essential (primary) hypertension: Secondary | ICD-10-CM | POA: Diagnosis not present

## 2020-02-22 DIAGNOSIS — Z23 Encounter for immunization: Secondary | ICD-10-CM | POA: Diagnosis not present

## 2020-02-29 DIAGNOSIS — E782 Mixed hyperlipidemia: Secondary | ICD-10-CM | POA: Diagnosis not present

## 2020-02-29 DIAGNOSIS — E039 Hypothyroidism, unspecified: Secondary | ICD-10-CM | POA: Diagnosis not present

## 2020-02-29 DIAGNOSIS — I10 Essential (primary) hypertension: Secondary | ICD-10-CM | POA: Diagnosis not present

## 2020-03-05 ENCOUNTER — Other Ambulatory Visit (HOSPITAL_COMMUNITY): Payer: Self-pay | Admitting: Internal Medicine

## 2020-03-05 DIAGNOSIS — E782 Mixed hyperlipidemia: Secondary | ICD-10-CM | POA: Diagnosis not present

## 2020-03-05 DIAGNOSIS — I10 Essential (primary) hypertension: Secondary | ICD-10-CM | POA: Diagnosis not present

## 2020-03-05 DIAGNOSIS — Z1231 Encounter for screening mammogram for malignant neoplasm of breast: Secondary | ICD-10-CM

## 2020-03-05 DIAGNOSIS — Z23 Encounter for immunization: Secondary | ICD-10-CM | POA: Diagnosis not present

## 2020-03-05 DIAGNOSIS — E039 Hypothyroidism, unspecified: Secondary | ICD-10-CM | POA: Diagnosis not present

## 2020-03-18 DIAGNOSIS — E782 Mixed hyperlipidemia: Secondary | ICD-10-CM | POA: Diagnosis not present

## 2020-03-18 DIAGNOSIS — I10 Essential (primary) hypertension: Secondary | ICD-10-CM | POA: Diagnosis not present

## 2020-03-18 DIAGNOSIS — E039 Hypothyroidism, unspecified: Secondary | ICD-10-CM | POA: Diagnosis not present

## 2020-05-03 DIAGNOSIS — I10 Essential (primary) hypertension: Secondary | ICD-10-CM | POA: Diagnosis not present

## 2020-05-03 DIAGNOSIS — E782 Mixed hyperlipidemia: Secondary | ICD-10-CM | POA: Diagnosis not present

## 2020-05-03 DIAGNOSIS — E039 Hypothyroidism, unspecified: Secondary | ICD-10-CM | POA: Diagnosis not present

## 2020-06-01 DIAGNOSIS — I10 Essential (primary) hypertension: Secondary | ICD-10-CM | POA: Diagnosis not present

## 2020-06-01 DIAGNOSIS — E782 Mixed hyperlipidemia: Secondary | ICD-10-CM | POA: Diagnosis not present

## 2020-06-01 DIAGNOSIS — E039 Hypothyroidism, unspecified: Secondary | ICD-10-CM | POA: Diagnosis not present

## 2020-07-01 DIAGNOSIS — E039 Hypothyroidism, unspecified: Secondary | ICD-10-CM | POA: Diagnosis not present

## 2020-07-01 DIAGNOSIS — I10 Essential (primary) hypertension: Secondary | ICD-10-CM | POA: Diagnosis not present

## 2020-07-01 DIAGNOSIS — E782 Mixed hyperlipidemia: Secondary | ICD-10-CM | POA: Diagnosis not present

## 2020-07-31 DIAGNOSIS — E782 Mixed hyperlipidemia: Secondary | ICD-10-CM | POA: Diagnosis not present

## 2020-07-31 DIAGNOSIS — I10 Essential (primary) hypertension: Secondary | ICD-10-CM | POA: Diagnosis not present

## 2020-07-31 DIAGNOSIS — E039 Hypothyroidism, unspecified: Secondary | ICD-10-CM | POA: Diagnosis not present

## 2020-09-04 DIAGNOSIS — U071 COVID-19: Secondary | ICD-10-CM | POA: Diagnosis not present

## 2020-09-04 DIAGNOSIS — M25561 Pain in right knee: Secondary | ICD-10-CM | POA: Diagnosis not present

## 2020-09-04 DIAGNOSIS — Z8616 Personal history of COVID-19: Secondary | ICD-10-CM | POA: Diagnosis not present

## 2020-09-09 DIAGNOSIS — R109 Unspecified abdominal pain: Secondary | ICD-10-CM | POA: Diagnosis not present

## 2020-09-09 DIAGNOSIS — Z1239 Encounter for other screening for malignant neoplasm of breast: Secondary | ICD-10-CM | POA: Diagnosis not present

## 2020-09-09 DIAGNOSIS — E782 Mixed hyperlipidemia: Secondary | ICD-10-CM | POA: Diagnosis not present

## 2020-09-09 DIAGNOSIS — I10 Essential (primary) hypertension: Secondary | ICD-10-CM | POA: Diagnosis not present

## 2020-09-09 DIAGNOSIS — E039 Hypothyroidism, unspecified: Secondary | ICD-10-CM | POA: Diagnosis not present

## 2020-09-09 DIAGNOSIS — E559 Vitamin D deficiency, unspecified: Secondary | ICD-10-CM | POA: Diagnosis not present

## 2020-10-31 DIAGNOSIS — E1165 Type 2 diabetes mellitus with hyperglycemia: Secondary | ICD-10-CM | POA: Diagnosis not present

## 2020-10-31 DIAGNOSIS — I10 Essential (primary) hypertension: Secondary | ICD-10-CM | POA: Diagnosis not present

## 2020-11-28 ENCOUNTER — Other Ambulatory Visit (HOSPITAL_COMMUNITY): Payer: Self-pay | Admitting: Internal Medicine

## 2020-11-28 DIAGNOSIS — Z1239 Encounter for other screening for malignant neoplasm of breast: Secondary | ICD-10-CM

## 2020-12-01 DIAGNOSIS — E1165 Type 2 diabetes mellitus with hyperglycemia: Secondary | ICD-10-CM | POA: Diagnosis not present

## 2020-12-01 DIAGNOSIS — I10 Essential (primary) hypertension: Secondary | ICD-10-CM | POA: Diagnosis not present

## 2020-12-02 ENCOUNTER — Other Ambulatory Visit (HOSPITAL_COMMUNITY): Payer: Self-pay | Admitting: Internal Medicine

## 2020-12-04 ENCOUNTER — Ambulatory Visit (HOSPITAL_COMMUNITY)
Admission: RE | Admit: 2020-12-04 | Discharge: 2020-12-04 | Disposition: A | Discharge status: Home / Self Care | Payer: Medicare Other | Source: Ambulatory Visit | Attending: Internal Medicine | Admitting: Internal Medicine

## 2020-12-04 ENCOUNTER — Other Ambulatory Visit: Payer: Self-pay

## 2020-12-04 DIAGNOSIS — Z1231 Encounter for screening mammogram for malignant neoplasm of breast: Secondary | ICD-10-CM | POA: Diagnosis not present

## 2020-12-04 DIAGNOSIS — Z1239 Encounter for other screening for malignant neoplasm of breast: Secondary | ICD-10-CM | POA: Diagnosis present

## 2020-12-05 ENCOUNTER — Other Ambulatory Visit (HOSPITAL_COMMUNITY): Payer: Self-pay | Admitting: Internal Medicine

## 2020-12-05 DIAGNOSIS — Z13820 Encounter for screening for osteoporosis: Secondary | ICD-10-CM

## 2020-12-12 ENCOUNTER — Ambulatory Visit (HOSPITAL_COMMUNITY)
Admission: RE | Admit: 2020-12-12 | Discharge: 2020-12-12 | Disposition: A | Discharge status: Home / Self Care | Payer: Medicare Other | Source: Ambulatory Visit | Attending: Internal Medicine | Admitting: Internal Medicine

## 2020-12-12 ENCOUNTER — Other Ambulatory Visit: Payer: Self-pay

## 2020-12-12 DIAGNOSIS — Z13820 Encounter for screening for osteoporosis: Secondary | ICD-10-CM | POA: Diagnosis not present

## 2020-12-12 DIAGNOSIS — M85852 Other specified disorders of bone density and structure, left thigh: Secondary | ICD-10-CM | POA: Diagnosis not present

## 2020-12-12 DIAGNOSIS — Z780 Asymptomatic menopausal state: Secondary | ICD-10-CM | POA: Insufficient documentation

## 2021-01-13 ENCOUNTER — Encounter: Payer: Self-pay | Admitting: *Deleted

## 2021-01-31 DIAGNOSIS — E1165 Type 2 diabetes mellitus with hyperglycemia: Secondary | ICD-10-CM | POA: Diagnosis not present

## 2021-01-31 DIAGNOSIS — I10 Essential (primary) hypertension: Secondary | ICD-10-CM | POA: Diagnosis not present

## 2021-02-25 DIAGNOSIS — E782 Mixed hyperlipidemia: Secondary | ICD-10-CM | POA: Diagnosis not present

## 2021-03-06 ENCOUNTER — Encounter: Payer: Self-pay | Admitting: *Deleted

## 2021-03-12 DIAGNOSIS — E782 Mixed hyperlipidemia: Secondary | ICD-10-CM | POA: Diagnosis not present

## 2021-03-12 DIAGNOSIS — Z1239 Encounter for other screening for malignant neoplasm of breast: Secondary | ICD-10-CM | POA: Diagnosis not present

## 2021-03-12 DIAGNOSIS — M8580 Other specified disorders of bone density and structure, unspecified site: Secondary | ICD-10-CM | POA: Diagnosis not present

## 2021-03-12 DIAGNOSIS — I10 Essential (primary) hypertension: Secondary | ICD-10-CM | POA: Diagnosis not present

## 2021-03-12 DIAGNOSIS — Z0001 Encounter for general adult medical examination with abnormal findings: Secondary | ICD-10-CM | POA: Diagnosis not present

## 2021-03-12 DIAGNOSIS — E559 Vitamin D deficiency, unspecified: Secondary | ICD-10-CM | POA: Diagnosis not present

## 2021-03-12 DIAGNOSIS — E039 Hypothyroidism, unspecified: Secondary | ICD-10-CM | POA: Diagnosis not present

## 2021-03-12 DIAGNOSIS — R109 Unspecified abdominal pain: Secondary | ICD-10-CM | POA: Diagnosis not present

## 2021-04-01 ENCOUNTER — Ambulatory Visit (INDEPENDENT_AMBULATORY_CARE_PROVIDER_SITE_OTHER): Payer: Self-pay | Admitting: *Deleted

## 2021-04-01 ENCOUNTER — Other Ambulatory Visit: Payer: Self-pay

## 2021-04-01 VITALS — Ht 64.0 in | Wt 189.4 lb

## 2021-04-01 DIAGNOSIS — Z86010 Personal history of colonic polyps: Secondary | ICD-10-CM

## 2021-04-01 MED ORDER — NA SULFATE-K SULFATE-MG SULF 17.5-3.13-1.6 GM/177ML PO SOLN: 1.0000 | Freq: Once | ORAL | 0 refills | Status: AC

## 2021-04-01 NOTE — Progress Notes (Signed)
Patient required increased sedation for her last procedure.  She will need propofol.  Per Dr. Roseanne Kaufman standard requirements, she will need an office visit to arrange colonoscopy.

## 2021-04-01 NOTE — Progress Notes (Signed)
Gastroenterology Pre-Procedure Review  Request Date: 04/01/2021 Requesting Physician: 5 year recall, Last TCS done 01/29/2016 by Dr. Gala Romney, diverticulosis, hx polyps (2012), sessile serrated adenoma (x1)  PATIENT REVIEW QUESTIONS: The patient responded to the following health history questions as indicated:    1. Diabetes Melitis: no 2. Joint replacements in the past 12 months: no 3. Major health problems in the past 3 months: no 4. Has an artificial valve or MVP: no 5. Has a defibrillator: no 6. Has been advised in past to take antibiotics in advance of a procedure like teeth cleaning: no 7. Family history of colon cancer: no  8. Alcohol Use: no 9. Illicit drug Use: no 10. History of sleep apnea: no  11. History of coronary artery or other vascular stents placed within the last 12 months: no 12. History of any prior anesthesia complications: yes, n/v 13. Body mass index is 32.51 kg/m.    MEDICATIONS & ALLERGIES:    Patient reports the following regarding taking any blood thinners:   Plavix? no Aspirin? Yes, 81 mg Coumadin? no Brilinta? no Xarelto? no Eliquis? no Pradaxa? no Savaysa? no Effient? no  Patient confirms/reports the following medications:  Current Outpatient Medications  Medication Sig Dispense Refill   aspirin 81 MG tablet Take 81 mg by mouth daily.       Aspirin-Acetaminophen-Caffeine (EXCEDRIN EXTRA STRENGTH PO) Take 2 tablets by mouth as needed.     Biotin 5000 MCG TABS Take by mouth 2 (two) times daily.     Calcium Carbonate-Vitamin D (CALCIUM 600 + D PO) Take 1 tablet by mouth daily at 6 (six) AM.     celecoxib (CELEBREX) 200 MG capsule Take 200 mg by mouth as needed.     Cholecalciferol (D3 2000) 50 MCG (2000 UT) CAPS Take by mouth 2 (two) times daily.     ibuprofen (ADVIL) 200 MG tablet Take 200 mg by mouth as needed. Takes 2 to 3 tablets as needed.     Multiple Vitamin (MULTIVITAMIN) tablet Take 1 tablet by mouth daily.       rosuvastatin (CRESTOR) 5  MG tablet Take 5 mg by mouth daily.       SYNTHROID 112 MCG tablet Take 1 tablet by mouth daily.     telmisartan-hydrochlorothiazide (MICARDIS HCT) 40-12.5 MG per tablet Take 1 tablet by mouth daily.       vitamin A 8000 UNIT capsule Take 3,000 Units by mouth daily.     No current facility-administered medications for this visit.    Patient confirms/reports the following allergies:  No Known Allergies  No orders of the defined types were placed in this encounter.   AUTHORIZATION INFORMATION Primary Insurance: [employer] Medicare,  ID #: [id number],  Group #: [id number] Pre-Cert / Josem Kaufmann required:  Pre-Cert / Auth #:   SCHEDULE INFORMATION: Procedure has been scheduled as follows:  Date: , Time:   Location: [facility] with Dr. Gala Romney  This Gastroenterology Pre-Precedure Review Form is being routed to the following provider(s): Aliene Altes, PA-C

## 2021-04-01 NOTE — Progress Notes (Signed)
Pt made aware that I would call her once Jan procedures have been released.

## 2021-04-02 NOTE — Progress Notes (Signed)
Spoke to pt.  Scheduled ov for 04/07/2021 at 11:00 with Neil Crouch, PA-C.  Pt made aware that she needs ov due to increased sedation last procedure.  Pt voiced understanding.

## 2021-04-07 ENCOUNTER — Ambulatory Visit: Payer: Medicare Other | Admitting: Gastroenterology

## 2021-04-07 ENCOUNTER — Encounter: Payer: Self-pay | Admitting: Gastroenterology

## 2021-04-07 ENCOUNTER — Other Ambulatory Visit: Payer: Self-pay

## 2021-04-07 ENCOUNTER — Telehealth: Payer: Self-pay

## 2021-04-07 VITALS — BP 145/82 | HR 60 | Temp 97.3°F | Ht 64.0 in | Wt 190.6 lb

## 2021-04-07 DIAGNOSIS — Z86010 Personal history of colonic polyps: Secondary | ICD-10-CM

## 2021-04-07 NOTE — Patient Instructions (Signed)
Colonoscopy to be scheduled. See separate instructions.  ?

## 2021-04-07 NOTE — Telephone Encounter (Signed)
Pt was offered TCS 04/14/21 but she already has plans this weekend. She is aware we will call her to schedule TCS w/Propofol ASA 2 w/Dr. Gala Romney when next schedule is available.

## 2021-04-07 NOTE — Progress Notes (Signed)
Primary Care Physician:  Celene Squibb, MD  Primary Gastroenterologist:  Garfield Cornea, MD   Chief Complaint  Patient presents with   hx polyps    HPI:  Kaitlyn Franklin is a 70 y.o. female here to schedule surveillance colonoscopy for history of colon polyps. Last colonoscopy 01/2016, diverticulosis. She has personal history of adenomatous colon polyps, recommended 5 year surveillance. Plans for propofol this time given increased conscious sedation needs.   Overall doing good from a GI standpoint.  Bowel movements are regular.  No blood in the stool or melena.  Denies any vomiting, heartburn, dysphagia.  Had COVID last year.  States she lost about 35 pounds but has gained much of this back.  Had significant amounts of coughing.  Since that time she has had some upper abdominal soreness which she feels is muscle related.  Not related to meals or bowel movements.  Has been seen by her PCP who felt like symptoms are muscle related and was started on Celebrex.  Seems to be worse with increased activity.  Also worse with prolonged sitting.  Sometimes associated with back pain.  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  Rare excedrin or ibuprofen use.  Current Outpatient Medications  Medication Sig Dispense Refill   aspirin 81 MG tablet Take 81 mg by mouth daily.       Aspirin-Acetaminophen-Caffeine (EXCEDRIN EXTRA STRENGTH PO) Take 2 tablets by mouth as needed.     Biotin 5000 MCG TABS Take by mouth 2 (two) times daily.     Calcium Carbonate-Vitamin D (CALCIUM 600 + D PO) Take 1 tablet by mouth daily at 6 (six) AM.     celecoxib (CELEBREX) 200 MG capsule Take 200 mg by mouth as needed.     Cholecalciferol (D3 2000) 50 MCG (2000 UT) CAPS Take by mouth 2 (two) times daily.     ibuprofen (ADVIL) 200 MG tablet Take 200 mg by mouth as needed. Takes 2 to 3 tablets as needed.     Multiple Vitamin  (MULTIVITAMIN) tablet Take 1 tablet by mouth daily.       rosuvastatin (CRESTOR) 5 MG tablet Take 5 mg by mouth daily.       SYNTHROID 112 MCG tablet Take 1 tablet by mouth daily.     telmisartan-hydrochlorothiazide (MICARDIS HCT) 40-12.5 MG per tablet Take 1 tablet by mouth daily.       vitamin A 8000 UNIT capsule Take 3,000 Units by mouth daily.     No current facility-administered medications for this visit.    Allergies as of 04/07/2021   (No Known Allergies)    Past Medical History:  Diagnosis Date   Hypercholesteremia    Hypertension    Hypothyroidism    PONV (postoperative nausea and vomiting)     Past Surgical History:  Procedure Laterality Date   BUNIONECTOMY  02/25/2012   Procedure: BUNIONECTOMY;  Surgeon: Marcheta Grammes, DPM;  Location: [facility];  Service: Orthopedics;  Laterality: Right;  [place] Bunionectomy Right Foot, [place] Bunionectomy Right Foot   COLONOSCOPY  12/31/2010   Procedure: COLONOSCOPY;  Surgeon: Daneil Dolin, MD;  Location: [facility];  Service: Endoscopy;  Laterality: N/A;  10:45 AM   COLONOSCOPY N/A 01/29/2016   Procedure: COLONOSCOPY;  Surgeon: Daneil Dolin, MD;  Location: [facility];  Service: Endoscopy;  Laterality: N/A;  9:30 AM    Family History  Problem  Relation Age of Onset   Colon cancer Neg Hx     Social History   Socioeconomic History   Marital status: Married    Spouse name: Not on file   Number of children: Not on file   Years of education: Not on file   Highest education level: Not on file  Occupational History   Not on file  Tobacco Use   Smoking status: Never   Smokeless tobacco: Never  Substance and Sexual Activity   Alcohol use: No   Drug use: No   Sexual activity: Yes    Birth control/protection: Post-menopausal  Other Topics Concern   Not on file  Social History Narrative   Not on file   Social Determinants of Health   Financial Resource Strain: Not on file  Food Insecurity: Not on file   Transportation Needs: Not on file  Physical Activity: Not on file  Stress: Not on file  Social Connections: Not on file  Intimate Partner Violence: Not on file      ROS:  General: Negative for anorexia, weight loss, fever, chills, fatigue, weakness. Eyes: Negative for vision changes.  ENT: Negative for hoarseness, difficulty swallowing , nasal congestion. CV: Negative for chest pain, angina, palpitations, dyspnea on exertion, peripheral edema.  Respiratory: Negative for dyspnea at rest, dyspnea on exertion, cough, sputum, wheezing.  GI: See history of present illness. GU:  Negative for dysuria, hematuria, urinary incontinence, urinary frequency, nocturnal urination.  MS: Negative for joint pain, low back pain.  Derm: Negative for rash or itching.  Neuro: Negative for weakness, abnormal sensation, seizure, frequent headaches, memory loss, confusion.  Psych: Negative for anxiety, depression, suicidal ideation, hallucinations.  Endo: Negative for unusual weight change.  Heme: Negative for bruising or bleeding. Allergy: Negative for rash or hives.    Physical Examination:  BP (!) 145/82   Pulse 60   Temp (!) 97.3 F (36.3 C)   Ht 5\' 4"  (1.626 m)   Wt 190 lb 9.6 oz (86.5 kg)   BMI 32.72 kg/m    General: Well-nourished, well-developed in no acute distress.  Head: Normocephalic, atraumatic.   Eyes: Conjunctiva pink, no icterus. Mouth: Oropharyngeal mucosa moist and pink , no lesions erythema or exudate. Neck: Supple without thyromegaly, masses, or lymphadenopathy.  Lungs: Clear to auscultation bilaterally.  Heart: Regular rate and rhythm, no murmurs rubs or gallops.  Abdomen: Bowel sounds are normal, nontender, nondistended, no hepatosplenomegaly or masses, no abdominal bruits or    hernia , no rebound or guarding.   Rectal: not performed Extremities: No lower extremity edema. No clubbing or deformities.  Neuro: Alert and oriented x 4 , grossly normal neurologically.  Skin:  Warm and dry, no rash or jaundice.   Psych: Alert and cooperative, normal mood and affect.     Assessment:  History of adenomatous colon polyps: Due for surveillance colonoscopy.  Upper abdominal discomfort: Noted since having COVID with prolonged coughing.  She believes symptoms are musculoskeletal but has been slow to resolve.  Symptoms are intermittent and often brought on with certain activities.  Unrelated to food or BMs.  Has used NSAIDs on a limited basis for this pain.  We discussed pursuing colonoscopy.  Could consider further imaging via CT if symptoms persist but at this time she is not interested.  Plan:  Colonoscopy with Dr. Gala Romney with propofol.  ASA 2.  I have discussed the risks, alternatives, benefits with regards to but not limited to the risk of reaction to medication, bleeding, infection,  perforation and the patient is agreeable to proceed. Written consent to be obtained.

## 2021-04-16 ENCOUNTER — Other Ambulatory Visit: Payer: Self-pay

## 2021-04-16 DIAGNOSIS — Z86010 Personal history of colonic polyps: Secondary | ICD-10-CM

## 2021-04-29 ENCOUNTER — Telehealth: Payer: Self-pay | Admitting: Internal Medicine

## 2021-04-29 NOTE — Telephone Encounter (Signed)
Pt is scheduled with Dr Gala Romney on 1/9 and said that [facility] doesn't have her prep prescription. [phone]

## 2021-04-30 MED ORDER — NA SULFATE-K SULFATE-MG SULF 17.5-3.13-1.6 GM/177ML PO SOLN: 1.0000 | ORAL | 0 refills | Status: AC

## 2021-04-30 NOTE — Telephone Encounter (Signed)
Rx resent to [facility]. Called and informed pt.

## 2021-05-02 DIAGNOSIS — I10 Essential (primary) hypertension: Secondary | ICD-10-CM | POA: Diagnosis not present

## 2021-05-02 DIAGNOSIS — E782 Mixed hyperlipidemia: Secondary | ICD-10-CM | POA: Diagnosis not present

## 2021-05-06 ENCOUNTER — Telehealth: Payer: Self-pay | Admitting: *Deleted

## 2021-05-06 NOTE — Telephone Encounter (Signed)
PA approved via [facility]. Auth# [id number], DOS May 12, 2021 - Aug 10, 2021

## 2021-05-09 ENCOUNTER — Other Ambulatory Visit (HOSPITAL_COMMUNITY)
Admission: RE | Admit: 2021-05-09 | Discharge: 2021-05-09 | Disposition: A | Discharge status: Home / Self Care | Payer: Medicare Other | Source: Ambulatory Visit | Attending: Internal Medicine | Admitting: Internal Medicine

## 2021-05-09 ENCOUNTER — Other Ambulatory Visit: Payer: Self-pay

## 2021-05-09 DIAGNOSIS — Z86010 Personal history of colonic polyps: Secondary | ICD-10-CM | POA: Diagnosis not present

## 2021-05-09 LAB — BASIC METABOLIC PANEL
Anion gap: 9 (ref 5–15)
BUN: 22 mg/dL (ref 8–23)
CO2: 28 mmol/L (ref 22–32)
Calcium: 9.5 mg/dL (ref 8.9–10.3)
Chloride: 102 mmol/L (ref 98–111)
Creatinine, Ser: 0.83 mg/dL (ref 0.44–1.00)
GFR, Estimated: >60 mL/min (ref >60)
Glucose, Bld: 94 mg/dL (ref 70–99)
Potassium: 4 mmol/L (ref 3.5–5.1)
Sodium: 139 mmol/L (ref 135–145)

## 2021-05-12 ENCOUNTER — Ambulatory Visit (HOSPITAL_COMMUNITY)
Admission: RE | Admit: 2021-05-12 | Discharge: 2021-05-12 | Disposition: A | Discharge status: Home / Self Care | Payer: Medicare Other | Attending: Internal Medicine | Admitting: Internal Medicine

## 2021-05-12 ENCOUNTER — Ambulatory Visit (HOSPITAL_COMMUNITY): Payer: Medicare Other | Admitting: Anesthesiology

## 2021-05-12 ENCOUNTER — Encounter (HOSPITAL_COMMUNITY)
Admission: RE | Disposition: A | Discharge status: Home / Self Care | Payer: Self-pay | Source: Home / Self Care | Attending: Internal Medicine

## 2021-05-12 ENCOUNTER — Other Ambulatory Visit: Payer: Self-pay

## 2021-05-12 ENCOUNTER — Encounter (HOSPITAL_COMMUNITY): Payer: Self-pay | Admitting: Internal Medicine

## 2021-05-12 DIAGNOSIS — E039 Hypothyroidism, unspecified: Secondary | ICD-10-CM | POA: Insufficient documentation

## 2021-05-12 DIAGNOSIS — Z86010 Personal history of colonic polyps: Secondary | ICD-10-CM | POA: Insufficient documentation

## 2021-05-12 DIAGNOSIS — K5730 Diverticulosis of large intestine without perforation or abscess without bleeding: Secondary | ICD-10-CM | POA: Diagnosis not present

## 2021-05-12 DIAGNOSIS — Z1211 Encounter for screening for malignant neoplasm of colon: Secondary | ICD-10-CM | POA: Diagnosis not present

## 2021-05-12 DIAGNOSIS — Q438 Other specified congenital malformations of intestine: Secondary | ICD-10-CM | POA: Insufficient documentation

## 2021-05-12 DIAGNOSIS — I10 Essential (primary) hypertension: Secondary | ICD-10-CM | POA: Diagnosis not present

## 2021-05-12 HISTORY — PX: COLONOSCOPY WITH PROPOFOL: SHX5780

## 2021-05-12 SURGERY — COLONOSCOPY WITH PROPOFOL
Anesthesia: General

## 2021-05-12 MED ORDER — PROPOFOL 10 MG/ML IV BOLUS
INTRAVENOUS | Status: DC | PRN
  Administered 2021-05-12 (×2): 100 mg via INTRAVENOUS
  Administered 2021-05-12 (×3): 50 mg via INTRAVENOUS

## 2021-05-12 MED ORDER — LIDOCAINE HCL (CARDIAC) PF 50 MG/5ML IV SOSY
PREFILLED_SYRINGE | INTRAVENOUS | Status: DC | PRN
  Administered 2021-05-12: 50 mg via INTRAVENOUS

## 2021-05-12 MED ORDER — LACTATED RINGERS IV SOLN
INTRAVENOUS | Status: DC
  Administered 2021-05-12: 1000 mL via INTRAVENOUS

## 2021-05-12 NOTE — Anesthesia Preprocedure Evaluation (Signed)
Anesthesia Evaluation  Patient identified by MRN, date of birth, ID band Patient awake    Reviewed: Allergy & Precautions, H&P , NPO status , Patient's Chart, lab work & pertinent test results, reviewed documented beta blocker date and time   History of Anesthesia Complications (+) PONV  Airway Mallampati: II  TM Distance: >3 FB Neck ROM: full    Dental no notable dental hx.    Pulmonary neg pulmonary ROS,    Pulmonary exam normal breath sounds clear to auscultation       Cardiovascular Exercise Tolerance: Good hypertension, negative cardio ROS   Rhythm:regular Rate:Normal     Neuro/Psych negative neurological ROS  negative psych ROS   GI/Hepatic negative GI ROS, Neg liver ROS,   Endo/Other  Hypothyroidism   Renal/GU negative Renal ROS  negative genitourinary   Musculoskeletal   Abdominal   Peds  Hematology negative hematology ROS (+)   Anesthesia Other Findings   Reproductive/Obstetrics negative OB ROS                             Anesthesia Physical Anesthesia Plan  ASA: 2  Anesthesia Plan: General   Post-op Pain Management:    Induction:   PONV Risk Score and Plan: Propofol infusion  Airway Management Planned:   Additional Equipment:   Intra-op Plan:   Post-operative Plan:   Informed Consent: I have reviewed the patients History and Physical, chart, labs and discussed the procedure including the risks, benefits and alternatives for the proposed anesthesia with the patient or authorized representative who has indicated his/her understanding and acceptance.     Dental Advisory Given  Plan Discussed with: CRNA  Anesthesia Plan Comments:         Anesthesia Quick Evaluation

## 2021-05-12 NOTE — Discharge Instructions (Addendum)
°  Colonoscopy Discharge Instructions  Read the instructions outlined below and refer to this sheet in the next few weeks. These discharge instructions provide you with general information on caring for yourself after you leave the hospital. Your doctor may also give you specific instructions. While your treatment has been planned according to the most current medical practices available, unavoidable complications occasionally occur. If you have any problems or questions after discharge, call Dr. Gala Romney at [phone]. ACTIVITY You may resume your regular activity, but move at a slower pace for the next 24 hours.  Take frequent rest periods for the next 24 hours.  Walking will help get rid of the air and reduce the bloated feeling in your belly (abdomen).  No driving for 24 hours (because of the medicine (anesthesia) used during the test).   Do not sign any important legal documents or operate any machinery for 24 hours (because of the anesthesia used during the test).  NUTRITION Drink plenty of fluids.  You may resume your normal diet as instructed by your doctor.  Begin with a light meal and progress to your normal diet. Heavy or fried foods are harder to digest and may make you feel sick to your stomach (nauseated).  Avoid alcoholic beverages for 24 hours or as instructed.  MEDICATIONS You may resume your normal medications unless your doctor tells you otherwise.  WHAT YOU CAN EXPECT TODAY Some feelings of bloating in the abdomen.  Passage of more gas than usual.  Spotting of blood in your stool or on the toilet paper.  IF YOU HAD POLYPS REMOVED DURING THE COLONOSCOPY: No aspirin products for 7 days or as instructed.  No alcohol for 7 days or as instructed.  Eat a soft diet for the next 24 hours.  FINDING OUT THE RESULTS OF YOUR TEST Not all test results are available during your visit. If your test results are not back during the visit, make an appointment with your caregiver to find out the  results. Do not assume everything is normal if you have not heard from your caregiver or the medical facility. It is important for you to follow up on all of your test results.  SEEK IMMEDIATE MEDICAL ATTENTION IF: You have more than a spotting of blood in your stool.  Your belly is swollen (abdominal distention).  You are nauseated or vomiting.  You have a temperature over 101.  You have abdominal pain or discomfort that is severe or gets worse throughout the day.     No polyps found today  Recommend 1 more surveillance colonoscopy in 7 years  At patient request, I called Ovidio Kin at [phone] -reviewed findings and recommendations

## 2021-05-12 NOTE — Transfer of Care (Signed)
Immediate Anesthesia Transfer of Care Note  Patient: Kaitlyn Franklin  Procedure(s) Performed: COLONOSCOPY WITH PROPOFOL  Patient Location: Endoscopy Unit  Anesthesia Type:General  Level of Consciousness: awake and patient cooperative  Airway & Oxygen Therapy: Patient Spontanous Breathing  Post-op Assessment: Report given to RN and Post -op Vital signs reviewed and stable  Post vital signs: Reviewed and stable  Last Vitals:  Vitals Value Taken Time  BP    Temp    Pulse    Resp    SpO2      Last Pain:  Vitals:   05/12/21 0952  TempSrc:   PainSc: 0-No pain      Patients Stated Pain Goal: 5 (32/12/24 8250)  Complications: No notable events documented.

## 2021-05-12 NOTE — H&P (Signed)
@LOGO @   Primary Care Physician:  Celene Squibb, MD Primary Gastroenterologist:  Dr. Gala Romney  Pre-Procedure History & Physical: HPI:  Kaitlyn Franklin is a 71 y.o. female here for events colonoscopy.  History of 8 mm polyp removed from hepatic flexure 2012; diverticulosis only found 2017; here for surveillance examination.  No bowel symptoms.  Past Medical History:  Diagnosis Date   Hypercholesteremia    Hypertension    Hypothyroidism    PONV (postoperative nausea and vomiting)     Past Surgical History:  Procedure Laterality Date   BUNIONECTOMY  02/25/2012   Procedure: BUNIONECTOMY;  Surgeon: Marcheta Grammes, DPM;  Location: [facility];  Service: Orthopedics;  Laterality: Right;  [place] Bunionectomy Right Foot, Tailors Bunionectomy Right Foot   COLONOSCOPY  12/31/2010   Procedure: COLONOSCOPY;  Surgeon: Daneil Dolin, MD;  Location: [facility];  Service: Endoscopy;  Laterality: N/A;  10:45 AM   COLONOSCOPY N/A 01/29/2016   Procedure: COLONOSCOPY;  Surgeon: Daneil Dolin, MD;  Location: [facility];  Service: Endoscopy;  Laterality: N/A;  9:30 AM    Prior to Admission medications   Medication Sig Start Date End Date Taking? Authorizing Provider  aspirin EC 81 MG tablet Take 81 mg by mouth in the morning. Swallow whole.   Yes [provider]  Aspirin-Acetaminophen-Caffeine (EXCEDRIN EXTRA STRENGTH PO) Take 2 tablets by mouth 3 (three) times daily as needed (headaches).   Yes [provider]  Biotin 5000 MCG TABS Take 5,000 mcg by mouth 2 (two) times daily.   Yes [provider]  Calcium Carbonate-Vitamin D (CALCIUM 600 + D PO) Take 1 tablet by mouth in the morning.   Yes [provider]  celecoxib (CELEBREX) 200 MG capsule Take 200 mg by mouth daily as needed.   Yes [provider]  Cholecalciferol (D3 2000) 50 MCG (2000 UT) CAPS Take 2,000 Units by mouth 2 (two) times daily.   Yes [provider]  ibuprofen (ADVIL) 200 MG  tablet Take 400-600 mg by mouth every 8 (eight) hours as needed (pain.).   Yes [provider]  Multiple Vitamin (MULTIVITAMIN WITH MINERALS) TABS tablet Take 1 tablet by mouth in the morning. Women 50+   Yes [provider]  Na Sulfate-K Sulfate-Mg Sulf (SUPREP BOWEL PREP KIT) 17.5-3.13-1.6 GM/177ML SOLN Take 1 kit by mouth as directed. 04/30/21  Yes Merrell Borsuk, Cristopher Estimable, MD  rosuvastatin (CRESTOR) 5 MG tablet Take 5 mg by mouth every evening.   Yes [provider]  SYNTHROID 112 MCG tablet Take 112 mcg by mouth daily before breakfast. 01/13/16  Yes [provider]  telmisartan (MICARDIS) 40 MG tablet Take 40 mg by mouth every morning. 04/08/21  Yes [provider]  vitamin A 3 MG (10000 UNITS) capsule Take 10,000 Units by mouth in the morning.   Yes [provider]    Allergies as of 04/16/2021   (No Known Allergies)    Family History  Problem Relation Age of Onset   Colon cancer Neg Hx     Social History   Socioeconomic History   Marital status: Married    Spouse name: Not on file   Number of children: Not on file   Years of education: Not on file   Highest education level: Not on file  Occupational History   Not on file  Tobacco Use   Smoking status: Never   Smokeless tobacco: Never  Substance and Sexual Activity   Alcohol use: No  Drug use: No   Sexual activity: Yes    Birth control/protection: Post-menopausal  Other Topics Concern   Not on file  Social History Narrative   Not on file   Social Determinants of Health   Financial Resource Strain: Not on file  Food Insecurity: Not on file  Transportation Needs: Not on file  Physical Activity: Not on file  Stress: Not on file  Social Connections: Not on file  Intimate Partner Violence: Not on file    Review of Systems: See HPI, otherwise negative ROS  Physical Exam: There were no vitals taken for this visit. General:   Alert,  Well-developed, well-nourished,  pleasant and cooperative in NAD Neck:  Supple; no masses or thyromegaly. No significant cervical adenopathy. Lungs:  Clear throughout to auscultation.   No wheezes, crackles, or rhonchi. No acute distress. Heart:  Regular rate and rhythm; no murmurs, clicks, rubs,  or gallops. Abdomen: Non-distended, normal bowel sounds.  Soft and nontender without appreciable mass or hepatosplenomegaly.  Pulses:  Normal pulses noted. Extremities:  Without clubbing or edema.  Impression/Plan: 71 year old lady here for surveillance colonoscopy.  History of colonic adenoma.  I have offered the patient a surveillance colonoscopy today. The risks, benefits, limitations, alternatives and imponderables have been reviewed with the patient. Questions have been answered. All parties are agreeable.       Notice: This dictation was prepared with Dragon dictation along with smaller phrase technology. Any transcriptional errors that result from this process are unintentional and may not be corrected upon review.

## 2021-05-12 NOTE — Op Note (Signed)
[facility] Patient Name: Kaitlyn Franklin Procedure Date: 05/12/2021 9:29 AM MRN: [id number] Date of Birth: [birth year] Attending MD: Norvel Richards , MD CSN: [id number] Age: 71 Admit Type: Outpatient Procedure:                Colonoscopy Indications:              High risk colon cancer surveillance: Personal                            history of colonic polyps Providers:                Norvel Richards, MD, Charlsie Quest. Theda Sers RN, RN,                            Aram Candela Referring MD:              Medicines:                Propofol per Anesthesia Complications:            No immediate complications. Estimated Blood Loss:     Estimated blood loss: none. Procedure:                Pre-Anesthesia Assessment:                           - Prior to the procedure, a History and Physical                            was performed, and patient medications and                            allergies were reviewed. The patient's tolerance of                            previous anesthesia was also reviewed. The risks                            and benefits of the procedure and the sedation                            options and risks were discussed with the patient.                            All questions were answered, and informed consent                            was obtained. Prior Anticoagulants: The patient has                            taken no previous anticoagulant or antiplatelet                            agents. ASA Grade Assessment: II - A patient with  mild systemic disease. After reviewing the risks                            and benefits, the patient was deemed in                            satisfactory condition to undergo the procedure.                           After obtaining informed consent, the colonoscope                            was passed under direct vision. Throughout the                            procedure, the patient's blood  pressure, pulse, and                            oxygen saturations were monitored continuously. The                            [id number]) scope was introduced through the                            anus and advanced to the the cecum, identified by                            appendiceal orifice and ileocecal valve. Scope In: 3:55:73 AM Scope Out: 10:17:52 AM Scope Withdrawal Time: 0 hours 6 minutes 21 seconds  Total Procedure Duration: 0 hours 20 minutes 53 seconds  Findings:      The perianal and digital rectal examinations were normal. Redundant,       elongated colon. External abdominal pressure and changing the patient's       position required to reach the cecum.      A few small-mouthed diverticula were found in the sigmoid colon and       descending colon. The colonic mucosa otherwise appeared normal. Rectal       mucosa seen well onface. Rectal vault too small to retroflex. Impression:               - Diverticulosis in the sigmoid colon and in the                            descending colon.                           Redundant and elongated colon as described Moderate Sedation:      Moderate (conscious) sedation was personally administered by an       anesthesia professional. The following parameters were monitored: oxygen       saturation, heart rate, blood pressure, respiratory rate, EKG, adequacy       of pulmonary ventilation, and response to care. Recommendation:           - Patient has a contact number available for  emergencies. The signs and symptoms of potential                            delayed complications were discussed with the                            patient. Return to normal activities tomorrow.                            Written discharge instructions were provided to the                            patient.                           - Advance diet as tolerated. 1 more surveillance                            colonoscopy in  7 years. Procedure Code(s):        --- Professional ---                           [phone], Colonoscopy, flexible; diagnostic, including                            collection of specimen(s) by brushing or washing,                            when performed (separate procedure) Diagnosis Code(s):        --- Professional ---                           Z86.010, Personal history of colonic polyps                           K57.30, Diverticulosis of large intestine without                            perforation or abscess without bleeding CPT copyright 2019 [employer]. All rights reserved. The codes documented in this report are preliminary and upon coder review may  be revised to meet current compliance requirements. Cristopher Estimable. Michiah Masse, MD Norvel Richards, MD 05/12/2021 10:45:15 AM This report has been signed electronically. Number of Addenda: 0

## 2021-05-12 NOTE — Anesthesia Postprocedure Evaluation (Signed)
Anesthesia Post Note  Patient: Kaitlyn Franklin  Procedure(s) Performed: COLONOSCOPY WITH PROPOFOL  Patient location during evaluation: Phase II Anesthesia Type: General Level of consciousness: awake Pain management: pain level controlled Vital Signs Assessment: post-procedure vital signs reviewed and stable Respiratory status: spontaneous breathing and respiratory function stable Cardiovascular status: blood pressure returned to baseline and stable Postop Assessment: no headache and no apparent nausea or vomiting Anesthetic complications: no Comments: Late entry   No notable events documented.   Last Vitals:  Vitals:   05/12/21 0940 05/12/21 1021  BP: (!) 166/89 (!) 145/72  Pulse: 95 77  Resp: 10 17  Temp: 36.9 C 36.7 C  SpO2: 99% 99%    Last Pain:  Vitals:   05/12/21 1021  TempSrc: Oral  PainSc: 0-No pain                 Louann Sjogren

## 2021-05-14 ENCOUNTER — Encounter (HOSPITAL_COMMUNITY): Payer: Self-pay | Admitting: Internal Medicine

## 2021-07-01 DIAGNOSIS — I10 Essential (primary) hypertension: Secondary | ICD-10-CM | POA: Diagnosis not present

## 2021-07-01 DIAGNOSIS — E782 Mixed hyperlipidemia: Secondary | ICD-10-CM | POA: Diagnosis not present

## 2021-08-01 DIAGNOSIS — I10 Essential (primary) hypertension: Secondary | ICD-10-CM | POA: Diagnosis not present

## 2021-08-01 DIAGNOSIS — E785 Hyperlipidemia, unspecified: Secondary | ICD-10-CM | POA: Diagnosis not present

## 2021-09-04 DIAGNOSIS — J029 Acute pharyngitis, unspecified: Secondary | ICD-10-CM | POA: Diagnosis not present

## 2021-09-05 DIAGNOSIS — E782 Mixed hyperlipidemia: Secondary | ICD-10-CM | POA: Diagnosis not present

## 2021-09-05 DIAGNOSIS — E039 Hypothyroidism, unspecified: Secondary | ICD-10-CM | POA: Diagnosis not present

## 2021-09-09 DIAGNOSIS — M8580 Other specified disorders of bone density and structure, unspecified site: Secondary | ICD-10-CM | POA: Diagnosis not present

## 2021-09-09 DIAGNOSIS — E039 Hypothyroidism, unspecified: Secondary | ICD-10-CM | POA: Diagnosis not present

## 2021-09-09 DIAGNOSIS — E559 Vitamin D deficiency, unspecified: Secondary | ICD-10-CM | POA: Diagnosis not present

## 2021-09-09 DIAGNOSIS — R109 Unspecified abdominal pain: Secondary | ICD-10-CM | POA: Diagnosis not present

## 2021-09-09 DIAGNOSIS — I10 Essential (primary) hypertension: Secondary | ICD-10-CM | POA: Diagnosis not present

## 2021-09-09 DIAGNOSIS — E782 Mixed hyperlipidemia: Secondary | ICD-10-CM | POA: Diagnosis not present

## 2022-03-05 DIAGNOSIS — E039 Hypothyroidism, unspecified: Secondary | ICD-10-CM | POA: Diagnosis not present

## 2022-03-05 DIAGNOSIS — E782 Mixed hyperlipidemia: Secondary | ICD-10-CM | POA: Diagnosis not present

## 2022-03-12 ENCOUNTER — Other Ambulatory Visit (HOSPITAL_COMMUNITY): Payer: Self-pay | Admitting: Internal Medicine

## 2022-03-12 DIAGNOSIS — Z0000 Encounter for general adult medical examination without abnormal findings: Secondary | ICD-10-CM | POA: Diagnosis not present

## 2022-03-12 DIAGNOSIS — M25562 Pain in left knee: Secondary | ICD-10-CM | POA: Diagnosis not present

## 2022-03-12 DIAGNOSIS — Z23 Encounter for immunization: Secondary | ICD-10-CM | POA: Diagnosis not present

## 2022-03-12 DIAGNOSIS — Z1239 Encounter for other screening for malignant neoplasm of breast: Secondary | ICD-10-CM

## 2022-03-12 DIAGNOSIS — M8580 Other specified disorders of bone density and structure, unspecified site: Secondary | ICD-10-CM | POA: Diagnosis not present

## 2022-03-12 DIAGNOSIS — E782 Mixed hyperlipidemia: Secondary | ICD-10-CM | POA: Diagnosis not present

## 2022-03-12 DIAGNOSIS — E559 Vitamin D deficiency, unspecified: Secondary | ICD-10-CM | POA: Diagnosis not present

## 2022-03-12 DIAGNOSIS — I10 Essential (primary) hypertension: Secondary | ICD-10-CM | POA: Diagnosis not present

## 2022-03-12 DIAGNOSIS — E039 Hypothyroidism, unspecified: Secondary | ICD-10-CM | POA: Diagnosis not present

## 2022-04-15 ENCOUNTER — Ambulatory Visit (HOSPITAL_COMMUNITY)
Admission: RE | Admit: 2022-04-15 | Discharge: 2022-04-15 | Disposition: A | Discharge status: Home / Self Care | Payer: Medicare Other | Source: Ambulatory Visit | Attending: Internal Medicine | Admitting: Internal Medicine

## 2022-04-15 DIAGNOSIS — Z1231 Encounter for screening mammogram for malignant neoplasm of breast: Secondary | ICD-10-CM | POA: Insufficient documentation

## 2022-04-15 DIAGNOSIS — Z1239 Encounter for other screening for malignant neoplasm of breast: Secondary | ICD-10-CM

## 2022-04-20 ENCOUNTER — Other Ambulatory Visit (HOSPITAL_COMMUNITY): Payer: Self-pay | Admitting: Internal Medicine

## 2022-04-20 DIAGNOSIS — R928 Other abnormal and inconclusive findings on diagnostic imaging of breast: Secondary | ICD-10-CM

## 2022-04-22 ENCOUNTER — Ambulatory Visit (HOSPITAL_COMMUNITY)
Admission: RE | Admit: 2022-04-22 | Discharge: 2022-04-22 | Disposition: A | Discharge status: Home / Self Care | Payer: Medicare Other | Source: Ambulatory Visit | Attending: Internal Medicine | Admitting: Internal Medicine

## 2022-04-22 ENCOUNTER — Encounter (HOSPITAL_COMMUNITY): Payer: Self-pay

## 2022-04-22 DIAGNOSIS — R928 Other abnormal and inconclusive findings on diagnostic imaging of breast: Secondary | ICD-10-CM | POA: Insufficient documentation

## 2022-04-22 DIAGNOSIS — R922 Inconclusive mammogram: Secondary | ICD-10-CM | POA: Diagnosis not present

## 2022-07-21 DIAGNOSIS — L570 Actinic keratosis: Secondary | ICD-10-CM | POA: Diagnosis not present

## 2022-07-21 DIAGNOSIS — D485 Neoplasm of uncertain behavior of skin: Secondary | ICD-10-CM | POA: Diagnosis not present

## 2022-07-21 DIAGNOSIS — L820 Inflamed seborrheic keratosis: Secondary | ICD-10-CM | POA: Diagnosis not present

## 2022-07-21 DIAGNOSIS — L0100 Impetigo, unspecified: Secondary | ICD-10-CM | POA: Diagnosis not present

## 2022-08-19 DIAGNOSIS — L663 Perifolliculitis capitis abscedens: Secondary | ICD-10-CM | POA: Diagnosis not present

## 2022-09-03 DIAGNOSIS — E039 Hypothyroidism, unspecified: Secondary | ICD-10-CM | POA: Diagnosis not present

## 2022-09-03 DIAGNOSIS — E782 Mixed hyperlipidemia: Secondary | ICD-10-CM | POA: Diagnosis not present

## 2022-09-10 DIAGNOSIS — Z0000 Encounter for general adult medical examination without abnormal findings: Secondary | ICD-10-CM | POA: Diagnosis not present

## 2022-09-10 DIAGNOSIS — M8580 Other specified disorders of bone density and structure, unspecified site: Secondary | ICD-10-CM | POA: Diagnosis not present

## 2022-09-10 DIAGNOSIS — E782 Mixed hyperlipidemia: Secondary | ICD-10-CM | POA: Diagnosis not present

## 2022-09-10 DIAGNOSIS — E039 Hypothyroidism, unspecified: Secondary | ICD-10-CM | POA: Diagnosis not present

## 2022-09-10 DIAGNOSIS — E559 Vitamin D deficiency, unspecified: Secondary | ICD-10-CM | POA: Diagnosis not present

## 2022-09-10 DIAGNOSIS — I10 Essential (primary) hypertension: Secondary | ICD-10-CM | POA: Diagnosis not present

## 2022-09-22 DIAGNOSIS — L663 Perifolliculitis capitis abscedens: Secondary | ICD-10-CM | POA: Diagnosis not present

## 2023-03-09 DIAGNOSIS — E559 Vitamin D deficiency, unspecified: Secondary | ICD-10-CM | POA: Diagnosis not present

## 2023-03-09 DIAGNOSIS — E782 Mixed hyperlipidemia: Secondary | ICD-10-CM | POA: Diagnosis not present

## 2023-03-09 DIAGNOSIS — E039 Hypothyroidism, unspecified: Secondary | ICD-10-CM | POA: Diagnosis not present

## 2023-03-15 DIAGNOSIS — M8580 Other specified disorders of bone density and structure, unspecified site: Secondary | ICD-10-CM | POA: Diagnosis not present

## 2023-03-15 DIAGNOSIS — E782 Mixed hyperlipidemia: Secondary | ICD-10-CM | POA: Diagnosis not present

## 2023-03-15 DIAGNOSIS — M25562 Pain in left knee: Secondary | ICD-10-CM | POA: Diagnosis not present

## 2023-03-15 DIAGNOSIS — I10 Essential (primary) hypertension: Secondary | ICD-10-CM | POA: Diagnosis not present

## 2023-03-15 DIAGNOSIS — Z23 Encounter for immunization: Secondary | ICD-10-CM | POA: Diagnosis not present

## 2023-03-15 DIAGNOSIS — E039 Hypothyroidism, unspecified: Secondary | ICD-10-CM | POA: Diagnosis not present

## 2023-03-15 DIAGNOSIS — E559 Vitamin D deficiency, unspecified: Secondary | ICD-10-CM | POA: Diagnosis not present

## 2023-03-16 ENCOUNTER — Other Ambulatory Visit (HOSPITAL_COMMUNITY): Payer: Self-pay | Admitting: Internal Medicine

## 2023-03-16 DIAGNOSIS — M8580 Other specified disorders of bone density and structure, unspecified site: Secondary | ICD-10-CM

## 2023-03-16 DIAGNOSIS — Z1231 Encounter for screening mammogram for malignant neoplasm of breast: Secondary | ICD-10-CM

## 2023-04-15 DIAGNOSIS — R0981 Nasal congestion: Secondary | ICD-10-CM | POA: Diagnosis not present

## 2023-04-15 DIAGNOSIS — H9201 Otalgia, right ear: Secondary | ICD-10-CM | POA: Diagnosis not present

## 2023-04-15 DIAGNOSIS — H9209 Otalgia, unspecified ear: Secondary | ICD-10-CM | POA: Diagnosis not present

## 2023-04-15 DIAGNOSIS — H6091 Unspecified otitis externa, right ear: Secondary | ICD-10-CM | POA: Diagnosis not present

## 2023-04-26 ENCOUNTER — Ambulatory Visit (HOSPITAL_COMMUNITY)
Admission: RE | Admit: 2023-04-26 | Discharge: 2023-04-26 | Disposition: A | Discharge status: Home / Self Care | Payer: Medicare Other | Source: Ambulatory Visit | Attending: Internal Medicine | Admitting: Internal Medicine

## 2023-04-26 DIAGNOSIS — M8589 Other specified disorders of bone density and structure, multiple sites: Secondary | ICD-10-CM | POA: Diagnosis not present

## 2023-04-26 DIAGNOSIS — Z13820 Encounter for screening for osteoporosis: Secondary | ICD-10-CM | POA: Diagnosis not present

## 2023-04-26 DIAGNOSIS — Z780 Asymptomatic menopausal state: Secondary | ICD-10-CM | POA: Insufficient documentation

## 2023-04-26 DIAGNOSIS — Z1231 Encounter for screening mammogram for malignant neoplasm of breast: Secondary | ICD-10-CM | POA: Insufficient documentation

## 2023-04-26 DIAGNOSIS — M8580 Other specified disorders of bone density and structure, unspecified site: Secondary | ICD-10-CM | POA: Insufficient documentation

## 2023-06-17 DIAGNOSIS — H6091 Unspecified otitis externa, right ear: Secondary | ICD-10-CM | POA: Diagnosis not present

## 2023-07-20 DIAGNOSIS — L814 Other melanin hyperpigmentation: Secondary | ICD-10-CM | POA: Diagnosis not present

## 2023-07-29 DIAGNOSIS — H6091 Unspecified otitis externa, right ear: Secondary | ICD-10-CM | POA: Diagnosis not present

## 2023-07-29 DIAGNOSIS — I10 Essential (primary) hypertension: Secondary | ICD-10-CM | POA: Diagnosis not present

## 2023-09-07 DIAGNOSIS — E782 Mixed hyperlipidemia: Secondary | ICD-10-CM | POA: Diagnosis not present

## 2023-09-07 DIAGNOSIS — E039 Hypothyroidism, unspecified: Secondary | ICD-10-CM | POA: Diagnosis not present

## 2023-09-07 DIAGNOSIS — E559 Vitamin D deficiency, unspecified: Secondary | ICD-10-CM | POA: Diagnosis not present

## 2023-09-13 DIAGNOSIS — E039 Hypothyroidism, unspecified: Secondary | ICD-10-CM | POA: Diagnosis not present

## 2023-09-13 DIAGNOSIS — M25562 Pain in left knee: Secondary | ICD-10-CM | POA: Diagnosis not present

## 2023-09-13 DIAGNOSIS — Z23 Encounter for immunization: Secondary | ICD-10-CM | POA: Diagnosis not present

## 2023-09-13 DIAGNOSIS — E559 Vitamin D deficiency, unspecified: Secondary | ICD-10-CM | POA: Diagnosis not present

## 2023-09-13 DIAGNOSIS — H6091 Unspecified otitis externa, right ear: Secondary | ICD-10-CM | POA: Diagnosis not present

## 2023-09-13 DIAGNOSIS — I10 Essential (primary) hypertension: Secondary | ICD-10-CM | POA: Diagnosis not present

## 2023-09-13 DIAGNOSIS — E782 Mixed hyperlipidemia: Secondary | ICD-10-CM | POA: Diagnosis not present

## 2023-09-13 DIAGNOSIS — H9190 Unspecified hearing loss, unspecified ear: Secondary | ICD-10-CM | POA: Diagnosis not present

## 2023-09-13 DIAGNOSIS — M8580 Other specified disorders of bone density and structure, unspecified site: Secondary | ICD-10-CM | POA: Diagnosis not present

## 2023-10-12 ENCOUNTER — Ambulatory Visit (INDEPENDENT_AMBULATORY_CARE_PROVIDER_SITE_OTHER): Admitting: Otolaryngology

## 2023-10-12 ENCOUNTER — Encounter (INDEPENDENT_AMBULATORY_CARE_PROVIDER_SITE_OTHER): Payer: Self-pay | Admitting: Otolaryngology

## 2023-10-12 VITALS — BP 137/81 | HR 64 | Ht 63.0 in | Wt 180.0 lb

## 2023-10-12 DIAGNOSIS — H6121 Impacted cerumen, right ear: Secondary | ICD-10-CM

## 2023-10-12 DIAGNOSIS — H6981 Other specified disorders of Eustachian tube, right ear: Secondary | ICD-10-CM

## 2023-10-12 DIAGNOSIS — H60331 Swimmer's ear, right ear: Secondary | ICD-10-CM

## 2023-10-12 MED ORDER — FLUTICASONE PROPIONATE 50 MCG/ACT NA SUSP: 2.0000 | Freq: Every day | NASAL | 10 refills | Status: AC

## 2023-10-14 DIAGNOSIS — H6981 Other specified disorders of Eustachian tube, right ear: Secondary | ICD-10-CM | POA: Insufficient documentation

## 2023-10-14 DIAGNOSIS — H60331 Swimmer's ear, right ear: Secondary | ICD-10-CM | POA: Insufficient documentation

## 2023-10-14 DIAGNOSIS — H6121 Impacted cerumen, right ear: Secondary | ICD-10-CM | POA: Insufficient documentation

## 2023-10-14 NOTE — Progress Notes (Signed)
 CC: Right ear pain, right ear clogging sensation  HPI:  Kaitlyn Franklin is a 73 y.o. female who presents today for evaluation of her right ear pain and right ear clogging sensation.  According to the patient, she first noted her right ear pain in November 2024.  She was diagnosed with a right ear infection, and was treated with Augmentin and Ciprodex eardrops.  The ear pain has mostly resolved.  However, she is experiencing a constant clogging sensation in her right ear.  She denies any significant hearing difficulty.  She has no previous ENT surgery.  Past Medical History:  Diagnosis Date   Hypercholesteremia    Hypertension    Hypothyroidism    PONV (postoperative nausea and vomiting)     Past Surgical History:  Procedure Laterality Date   BUNIONECTOMY  02/25/2012   Procedure: BUNIONECTOMY;  Surgeon: Dewayne Ford, DPM;  Location: [facility];  Service: Orthopedics;  Laterality: Right;  [place] Bunionectomy Right Foot, Tailors Bunionectomy Right Foot   COLONOSCOPY  12/31/2010   Procedure: COLONOSCOPY;  Surgeon: Suzette Espy, MD;  Location: [facility];  Service: Endoscopy;  Laterality: N/A;  10:45 AM   COLONOSCOPY N/A 01/29/2016   Procedure: COLONOSCOPY;  Surgeon: Suzette Espy, MD;  Location: [facility];  Service: Endoscopy;  Laterality: N/A;  9:30 AM   COLONOSCOPY WITH PROPOFOL  N/A 05/12/2021   Procedure: COLONOSCOPY WITH PROPOFOL ;  Surgeon: Suzette Espy, MD;  Location: [facility];  Service: Endoscopy;  Laterality: N/A;  10:45am    Family History  Problem Relation Age of Onset   Colon cancer Neg Hx     Social History:  reports that she has never smoked. She has never used smokeless tobacco. She reports that she does not drink alcohol and does not use drugs.  Allergies: No Known Allergies  Prior to Admission medications   Medication Sig Start Date End Date Taking? Authorizing Provider  aspirin EC 81 MG tablet Take 81 mg by mouth in the morning. Swallow whole.    Yes [provider]  Aspirin-Acetaminophen-Caffeine (EXCEDRIN EXTRA STRENGTH PO) Take 2 tablets by mouth 2 (two) times daily as needed (headaches).   Yes [provider]  Biotin 5000 MCG TABS Take 5,000 mcg by mouth 2 (two) times daily.   Yes [provider]  Calcium Carbonate-Vitamin D (CALCIUM 600 + D PO) Take 1 tablet by mouth in the morning.   Yes [provider]  celecoxib (CELEBREX) 200 MG capsule Take 200 mg by mouth daily as needed.   Yes [provider]  Cholecalciferol (D3 2000) 50 MCG (2000 UT) CAPS Take 2,000 Units by mouth 2 (two) times daily.   Yes [provider]  fluticasone (FLONASE) 50 MCG/ACT nasal spray Place 2 sprays into both nostrils daily. 10/12/23 11/11/23 Yes Reynold Caves, MD  ibuprofen (ADVIL) 200 MG tablet Take 400-600 mg by mouth every 8 (eight) hours as needed (pain.).   Yes [provider]  Multiple Vitamin (MULTIVITAMIN WITH MINERALS) TABS tablet Take 1 tablet by mouth in the morning. Women 50+   Yes [provider]  Na Sulfate-K Sulfate-Mg Sulf (SUPREP BOWEL PREP KIT) 17.5-3.13-1.6 GM/177ML SOLN Take 1 kit by mouth as directed. 04/30/21  Yes Rourk, Windsor Hatcher, MD  rosuvastatin (CRESTOR) 5 MG tablet Take 5 mg by mouth every evening.   Yes [provider]  SYNTHROID 112 MCG tablet Take 112 mcg by mouth daily before breakfast. 01/13/16  Yes [provider]  telmisartan (MICARDIS)  40 MG tablet Take 40 mg by mouth every morning. 04/08/21  Yes [provider]  vitamin A 3 MG (10000 UNITS) capsule Take 10,000 Units by mouth in the morning.   Yes [provider]    Blood pressure 137/81, pulse 64, height 5' 3 (1.6 m), weight 180 lb (81.6 kg), SpO2 97%. Exam: General: Communicates without difficulty, well nourished, no acute distress. Head: Normocephalic, no evidence injury, no tenderness, facial buttresses intact without stepoff. Face/sinus: No tenderness to palpation and  percussion. Facial movement is normal and symmetric. Eyes: PERRL, EOMI. No scleral icterus, conjunctivae clear. Neuro: CN II exam reveals vision grossly intact.  No nystagmus at any point of gaze. Ears: Auricles well formed without lesions.  Right ear cerumen impaction.  The left ear canal and tympanic membrane are normal.  Nose: External evaluation reveals normal support and skin without lesions.  Dorsum is intact.  Anterior rhinoscopy reveals congested mucosa over anterior aspect of inferior turbinates and intact septum.  No purulence noted. Oral:  Oral cavity and oropharynx are intact, symmetric, without erythema or edema.  Mucosa is moist without lesions. Neck: Full range of motion without pain.  There is no significant lymphadenopathy.  No masses palpable.  Thyroid  bed within normal limits to palpation.  Parotid glands and submandibular glands equal bilaterally without mass.  Trachea is midline. Neuro:  CN 2-12 grossly intact.   Procedure: Right ear cerumen disimpaction Anesthesia: None Description: Under the operating microscope, the cerumen is carefully removed with a combination of cerumen currette, alligator forceps, and suction catheters.  After the cerumen is removed, the TMs are noted to be normal.  No mass, erythema, or lesions. The patient tolerated the procedure well.   Assessment: 1.  Incidental finding of right ear cerumen impaction.  After the disimpaction procedure, the tympanic membranes and middle ear spaces are normal.  No middle ear effusion is noted. 2.  The history is suggestive of right ear eustachian tube dysfunction. 3.  Her previous right otitis externa has resolved.  Plan: 1.  Otomicroscopy with right ear cerumen disimpaction. 2.  The physical exam findings are reviewed with the patient. 3.  Flonase nasal spray 2 sprays each nostril daily. 4.  Valsalva exercise multiple times a day. 5.  The patient will return for reevaluation in 6 weeks.  Zacary Bauer W Daiwik Buffalo 10/14/2023, 11:48  AM

## 2023-11-24 ENCOUNTER — Encounter (INDEPENDENT_AMBULATORY_CARE_PROVIDER_SITE_OTHER): Payer: Self-pay | Admitting: Otolaryngology

## 2023-11-24 ENCOUNTER — Ambulatory Visit (INDEPENDENT_AMBULATORY_CARE_PROVIDER_SITE_OTHER): Admitting: Otolaryngology

## 2023-11-24 VITALS — BP 145/85 | HR 72

## 2023-11-24 DIAGNOSIS — H6981 Other specified disorders of Eustachian tube, right ear: Secondary | ICD-10-CM | POA: Diagnosis not present

## 2023-11-24 DIAGNOSIS — H60331 Swimmer's ear, right ear: Secondary | ICD-10-CM

## 2023-11-24 DIAGNOSIS — H6091 Unspecified otitis externa, right ear: Secondary | ICD-10-CM

## 2023-11-24 MED ORDER — CIPROFLOXACIN-DEXAMETHASONE 0.3-0.1 % OT SUSP: 4.0000 [drp] | Freq: Two times a day (BID) | OTIC | 8 refills | Status: AC

## 2023-11-25 NOTE — Progress Notes (Signed)
 Patient ID: Kaitlyn Franklin, female   DOB: 1950/11/08, 73 y.o.   MRN: [id number]  Follow-up: Right ear eustachian tube dysfunction, recurrent ear infections  HPI: The patient is a 73 year old female who returns today for her follow-up evaluation.  The patient has a history of recurrent right ear infections.  She was last seen in June 2025.  At that time, her right ear infection had resolved.  Her history was suggestive of right ear eustachian tube dysfunction.  She was treated with Flonase  nasal spray and Valsalva exercise.  The patient returns today complaining of increasing clogging sensation in her right ear for the past few weeks.  She has not been able to auto insufflate her right middle ear space with the Valsalva exercise.  Exam: General: Communicates without difficulty, well nourished, no acute distress. Head: Normocephalic, no evidence injury, no tenderness, facial buttresses intact without stepoff. Face/sinus: No tenderness to palpation and percussion. Facial movement is normal and symmetric. Eyes: PERRL, EOMI. No scleral icterus, conjunctivae clear. Neuro: CN II exam reveals vision grossly intact.  No nystagmus at any point of gaze. Ears: Auricles well formed without lesions.  The left ear canal and tympanic membrane are normal.  Purulent drainage is noted within the right ear canal.  The right ear canal is edematous and erythematous.  Under the operating microscope, the right ear canal is debrided.  Ciprodex  eardrops are applied.  Nose: External evaluation reveals normal support and skin without lesions.  Dorsum is intact.  Anterior rhinoscopy reveals congested mucosa over anterior aspect of inferior turbinates and intact septum.  No purulence noted. Oral:  Oral cavity and oropharynx are intact, symmetric, without erythema or edema.  Mucosa is moist without lesions. Neck: Full range of motion without pain.  There is no significant lymphadenopathy.  No masses palpable.  Thyroid  bed within normal  limits to palpation.  Parotid glands and submandibular glands equal bilaterally without mass.  Trachea is midline. Neuro:  CN 2-12 grossly intact.   Assessment: 1.  Recurrent right otitis externa.  The right ear canal is edematous, erythematous, and filled with purulent drainage. 2.  Right ear eustachian tube dysfunction. 3.  The left ear canal and tympanic membrane are normal.  Plan: 1.  The physical exam findings are reviewed with the patient. 2.  Otomicroscopy with debridement of the right ear canal. 3.  Ciprodex  eardrops 4 drops right ear twice daily for 10 days. 4.  The patient will return for reevaluation in 2 weeks.

## 2023-12-13 ENCOUNTER — Encounter (INDEPENDENT_AMBULATORY_CARE_PROVIDER_SITE_OTHER): Payer: Self-pay | Admitting: Otolaryngology

## 2023-12-13 ENCOUNTER — Ambulatory Visit (INDEPENDENT_AMBULATORY_CARE_PROVIDER_SITE_OTHER): Admitting: Otolaryngology

## 2023-12-13 VITALS — BP 133/79 | HR 68

## 2023-12-13 DIAGNOSIS — H6091 Unspecified otitis externa, right ear: Secondary | ICD-10-CM

## 2023-12-13 DIAGNOSIS — H60331 Swimmer's ear, right ear: Secondary | ICD-10-CM

## 2023-12-15 NOTE — Progress Notes (Signed)
 Patient ID: Kaitlyn Franklin, female   DOB: [birth year], 73 y.o.   MRN: [id number]  Follow-up: Recurrent right otitis externa  HPI: The patient is a 73 year old female who returns today for her follow-up evaluation.  She was last seen 2 weeks ago.  At that time, she was noted to have recurrent right otitis externa.  The right ear canal was edematous and erythematous, and was filled with purulent drainage.  The patient was treated with debridement and Ciprodex  eardrops.  The patient returns today complaining of persistent clogging sensation in her right ear.  She denies any significant otalgia.  Her right ear hearing is still muffled.  Exam: General: Communicates without difficulty, well nourished, no acute distress. Head: Normocephalic, no evidence injury, no tenderness, facial buttresses intact without stepoff. Face/sinus: No tenderness to palpation and percussion. Facial movement is normal and symmetric. Eyes: PERRL, EOMI. No scleral icterus, conjunctivae clear. Neuro: CN II exam reveals vision grossly intact.  No nystagmus at any point of gaze. Ears: Auricles well formed without lesions.  The left ear canal and tympanic membrane are normal.  Purulent drainage is again noted within the right ear canal.  The right ear canal is edematous and erythematous.  Under the operating microscope, the right ear canal is debrided.  Ciprodex  eardrops are applied.  Polypoid tissue is noted to partially cover the tympanic membrane.  Nose: External evaluation reveals normal support and skin without lesions.  Dorsum is intact.  Anterior rhinoscopy reveals congested mucosa over anterior aspect of inferior turbinates and intact septum.  No purulence noted. Oral:  Oral cavity and oropharynx are intact, symmetric, without erythema or edema.  Mucosa is moist without lesions. Neck: Full range of motion without pain.  There is no significant lymphadenopathy.  No masses palpable.  Thyroid  bed within normal limits to palpation.   Parotid glands and submandibular glands equal bilaterally without mass.  Trachea is midline. Neuro:  CN 2-12 grossly intact.   Assessment: 1.  Persistent right otitis externa.  The right ear canal is edematous, erythematous, and filled with purulent drainage. 2.  Polypoid tissue is noted to partially cover the right tympanic membrane. 3.  The left ear canal and tympanic membrane are normal.  Plan: 1.  The physical exam findings are reviewed with the patient. 2.  Otomicroscopy with debridement of the right ear canal. 3.  Discontinue Ciprodex  eardrops 4 drops right ear twice daily for another week. 4.  The patient will return for reevaluation in 1 month.

## 2024-01-13 ENCOUNTER — Ambulatory Visit (INDEPENDENT_AMBULATORY_CARE_PROVIDER_SITE_OTHER): Admitting: Otolaryngology

## 2024-01-13 ENCOUNTER — Encounter (INDEPENDENT_AMBULATORY_CARE_PROVIDER_SITE_OTHER): Payer: Self-pay | Admitting: Otolaryngology

## 2024-01-13 VITALS — BP 113/71 | HR 66

## 2024-01-13 DIAGNOSIS — H60331 Swimmer's ear, right ear: Secondary | ICD-10-CM

## 2024-01-13 DIAGNOSIS — H6091 Unspecified otitis externa, right ear: Secondary | ICD-10-CM | POA: Diagnosis not present

## 2024-01-15 NOTE — Progress Notes (Signed)
 Patient ID: Kaitlyn Franklin, female   DOB: 1950/05/18, 73 y.o.   MRN: [id number]  Follow-up: Recurrent right otitis externa  HPI: The patient is a 73 year old female who returns today for her follow-up evaluation.  The patient has a history of recurrent right otitis externa.  At her last visit 1 month ago, she was noted to have persistent right otitis externa.  The right ear canal was edematous, erythematous, and filled with purulent drainage.  Polypoid tissue was noted to partially cover the right tympanic membrane.  She was treated with debridement and Ciprodex  eardrops.  The patient returns today complaining of persistent clogging sensation in her right ear.  Currently she denies any significant otalgia.  Her right ear hearing is muffled.  Exam: General: Communicates without difficulty, well nourished, no acute distress. Head: Normocephalic, no evidence injury, no tenderness, facial buttresses intact without stepoff. Face/sinus: No tenderness to palpation and percussion. Facial movement is normal and symmetric. Eyes: PERRL, EOMI. No scleral icterus, conjunctivae clear. Neuro: CN II exam reveals vision grossly intact.  No nystagmus at any point of gaze. Ears: Auricles well formed without lesions.  The left ear canal and tympanic membrane are normal.  Purulent drainage is again noted within the right ear canal.  The right ear canal is edematous and erythematous.  Under the operating microscope, the right ear canal is debrided.  CSF powder is applied.  Nose: External evaluation reveals normal support and skin without lesions.  Dorsum is intact.  Anterior rhinoscopy reveals congested mucosa over anterior aspect of inferior turbinates and intact septum.  No purulence noted. Oral:  Oral cavity and oropharynx are intact, symmetric, without erythema or edema.  Mucosa is moist without lesions. Neck: Full range of motion without pain.  There is no significant lymphadenopathy.  No masses palpable.  Thyroid  bed  within normal limits to palpation.  Parotid glands and submandibular glands equal bilaterally without mass.  Trachea is midline. Neuro:  CN 2-12 grossly intact.    Assessment: 1.  Persistent right otitis externa.  2.  The patient has not responded to treatment with Ciprodex  eardrops. 3.  The left ear canal and tympanic membrane are normal.  Plan: 1.  The physical exam findings are reviewed with the patient. 2.  Otomicroscopy with debridement of the right ear canal. 3.  CSF powder daily to treat the right chronic otitis externa. 4.  The patient will return for reevaluation in 2 weeks.

## 2024-01-31 ENCOUNTER — Ambulatory Visit (INDEPENDENT_AMBULATORY_CARE_PROVIDER_SITE_OTHER): Admitting: Otolaryngology

## 2024-01-31 ENCOUNTER — Encounter (INDEPENDENT_AMBULATORY_CARE_PROVIDER_SITE_OTHER): Payer: Self-pay | Admitting: Otolaryngology

## 2024-01-31 VITALS — BP 143/79 | HR 63 | Temp 98.0°F | Ht 64.0 in | Wt 189.0 lb

## 2024-01-31 DIAGNOSIS — H9391 Unspecified disorder of right ear: Secondary | ICD-10-CM | POA: Diagnosis not present

## 2024-01-31 DIAGNOSIS — H60331 Swimmer's ear, right ear: Secondary | ICD-10-CM

## 2024-01-31 NOTE — Progress Notes (Unsigned)
 Patient ID: Kaitlyn Franklin, female   DOB: 1950/05/18, 73 y.o.   MRN: [id number]  Follow-up: Recurrent right otitis externa  HPI: The patient is a 73 year old female who returns today for her follow-up evaluation.  The patient has a history of recurrent right otitis externa.  At her last visit 1 month ago, she was noted to have persistent right otitis externa.  The right ear canal was edematous, erythematous, and filled with purulent drainage.  Polypoid tissue was noted to partially cover the right tympanic membrane.  She was treated with debridement and Ciprodex  eardrops.  The patient returns today complaining of persistent clogging sensation in her right ear.  Currently she denies any significant otalgia.  Her right ear hearing is muffled.  Exam: General: Communicates without difficulty, well nourished, no acute distress. Head: Normocephalic, no evidence injury, no tenderness, facial buttresses intact without stepoff. Face/sinus: No tenderness to palpation and percussion. Facial movement is normal and symmetric. Eyes: PERRL, EOMI. No scleral icterus, conjunctivae clear. Neuro: CN II exam reveals vision grossly intact.  No nystagmus at any point of gaze. Ears: Auricles well formed without lesions.  The left ear canal and tympanic membrane are normal.  Purulent drainage is again noted within the right ear canal.  The right ear canal is edematous and erythematous.  Under the operating microscope, the right ear canal is debrided.  CSF powder is applied.  Nose: External evaluation reveals normal support and skin without lesions.  Dorsum is intact.  Anterior rhinoscopy reveals congested mucosa over anterior aspect of inferior turbinates and intact septum.  No purulence noted. Oral:  Oral cavity and oropharynx are intact, symmetric, without erythema or edema.  Mucosa is moist without lesions. Neck: Full range of motion without pain.  There is no significant lymphadenopathy.  No masses palpable.  Thyroid  bed  within normal limits to palpation.  Parotid glands and submandibular glands equal bilaterally without mass.  Trachea is midline. Neuro:  CN 2-12 grossly intact.    Assessment: 1.  Persistent right otitis externa.  2.  The patient has not responded to treatment with Ciprodex  eardrops. 3.  The left ear canal and tympanic membrane are normal.  Plan: 1.  The physical exam findings are reviewed with the patient. 2.  Otomicroscopy with debridement of the right ear canal. 3.  CSF powder daily to treat the right chronic otitis externa. 4.  The patient will return for reevaluation in 2 weeks.

## 2024-04-10 ENCOUNTER — Other Ambulatory Visit (HOSPITAL_COMMUNITY): Payer: Self-pay | Admitting: Internal Medicine

## 2024-04-10 DIAGNOSIS — Z1231 Encounter for screening mammogram for malignant neoplasm of breast: Secondary | ICD-10-CM

## 2024-04-28 ENCOUNTER — Encounter (HOSPITAL_COMMUNITY): Payer: Self-pay

## 2024-04-28 ENCOUNTER — Ambulatory Visit (HOSPITAL_COMMUNITY)
Admission: RE | Admit: 2024-04-28 | Discharge: 2024-04-28 | Disposition: A | Discharge status: Home / Self Care | Source: Ambulatory Visit | Attending: Internal Medicine | Admitting: Internal Medicine

## 2024-04-28 DIAGNOSIS — Z1231 Encounter for screening mammogram for malignant neoplasm of breast: Secondary | ICD-10-CM | POA: Insufficient documentation

## 2024-05-05 ENCOUNTER — Encounter (INDEPENDENT_AMBULATORY_CARE_PROVIDER_SITE_OTHER): Payer: Self-pay | Admitting: Otolaryngology

## 2024-05-05 ENCOUNTER — Ambulatory Visit (INDEPENDENT_AMBULATORY_CARE_PROVIDER_SITE_OTHER): Admitting: Otolaryngology

## 2024-05-05 VITALS — BP 145/81 | HR 70 | Ht 64.0 in | Wt 184.0 lb

## 2024-05-05 DIAGNOSIS — H60331 Swimmer's ear, right ear: Secondary | ICD-10-CM

## 2024-05-05 DIAGNOSIS — H6061 Unspecified chronic otitis externa, right ear: Secondary | ICD-10-CM

## 2024-05-05 NOTE — Progress Notes (Signed)
 Patient ID: Barnie KANDICE Moose, female   DOB: 1951-03-14, 74 y.o.   MRN: [id number]  Follow up: Recurrent otitis externa  History of Present Illness NEVELYN MELLOTT is a 74 year old female with chronic right otitis externa who presents for routine otolaryngology follow-up.  She has a history of chronic otitis externa of the right ear, previously marked by recurrent infections and difficulty achieving symptom control. Over the past three months, she has had no otalgia, otorrhea, swelling, or signs of infection.  She currently applies CSF powder to the right ear once weekly, typically on Saturdays, using approximately one capsule per month obtained from [facility].   Exam: General: Communicates without difficulty, well nourished, no acute distress. Head: Normocephalic, no evidence injury, no tenderness, facial buttresses intact without stepoff. Face/sinus: No tenderness to palpation and percussion. Facial movement is normal and symmetric. Eyes: PERRL, EOMI. No scleral icterus, conjunctivae clear. Neuro: CN II exam reveals vision grossly intact.  No nystagmus at any point of gaze. Ears: Auricles well formed without lesions.  Ear canals are intact without mass or lesion.  No erythema or edema is appreciated.  The TMs are intact without fluid. Nose: External evaluation reveals normal support and skin without lesions.  Dorsum is intact.  Anterior rhinoscopy reveals congested mucosa over anterior aspect of inferior turbinates and intact septum.  No purulence noted. Oral:  Oral cavity and oropharynx are intact, symmetric, without erythema or edema.  Mucosa is moist without lesions. Neck: Full range of motion without pain.  There is no significant lymphadenopathy.  No masses palpable.  Thyroid  bed within normal limits to palpation.  Parotid glands and submandibular glands equal bilaterally without mass.  Trachea is midline. Neuro:  CN 2-12 grossly intact.    Assessment & Plan Chronic right otitis  externa Chronic right otitis externa, previously difficult to control, is currently well-managed and asymptomatic with the current prophylactic regimen. - Recommended continued prophylactic CSF powder once weekly as previously prescribed. - Advised to obtain refills from [facility] as needed. - Scheduled follow-up in six months for re-evaluation.

## 2024-05-06 DIAGNOSIS — H6061 Unspecified chronic otitis externa, right ear: Secondary | ICD-10-CM | POA: Insufficient documentation

## 2024-11-10 ENCOUNTER — Ambulatory Visit (INDEPENDENT_AMBULATORY_CARE_PROVIDER_SITE_OTHER): Admitting: Otolaryngology
